# Patient Record
Sex: Female | Born: 1935
Health system: Southern US, Community
[De-identification: ages and names within clinical notes are randomized; demographics above are authoritative.]

## PROBLEM LIST (undated history)

## (undated) DIAGNOSIS — E785 Hyperlipidemia, unspecified: Secondary | ICD-10-CM

## (undated) DIAGNOSIS — I10 Essential (primary) hypertension: Secondary | ICD-10-CM

## (undated) DIAGNOSIS — R0602 Shortness of breath: Secondary | ICD-10-CM

## (undated) DIAGNOSIS — I639 Cerebral infarction, unspecified: Secondary | ICD-10-CM

## (undated) DIAGNOSIS — I4891 Unspecified atrial fibrillation: Secondary | ICD-10-CM

---

## 2001-03-26 ENCOUNTER — Encounter: Admission: RE | Admit: 2001-03-26 | Discharge: 2001-03-26 | Payer: Self-pay | Admitting: Emergency Medicine

## 2001-03-26 ENCOUNTER — Encounter: Payer: Self-pay | Admitting: Emergency Medicine

## 2005-12-06 ENCOUNTER — Encounter: Payer: Self-pay | Admitting: Emergency Medicine

## 2005-12-07 ENCOUNTER — Inpatient Hospital Stay (HOSPITAL_COMMUNITY): Admission: EM | Admit: 2005-12-07 | Discharge: 2005-12-10 | Payer: Self-pay | Admitting: Internal Medicine

## 2005-12-09 ENCOUNTER — Encounter (INDEPENDENT_AMBULATORY_CARE_PROVIDER_SITE_OTHER): Payer: Self-pay | Admitting: *Deleted

## 2006-03-25 ENCOUNTER — Inpatient Hospital Stay (HOSPITAL_COMMUNITY): Admission: EM | Admit: 2006-03-25 | Discharge: 2006-03-31 | Payer: Self-pay | Admitting: *Deleted

## 2006-03-26 ENCOUNTER — Ambulatory Visit: Payer: Self-pay | Admitting: Physical Medicine & Rehabilitation

## 2006-03-26 ENCOUNTER — Ambulatory Visit: Payer: Self-pay | Admitting: Cardiology

## 2006-03-26 ENCOUNTER — Encounter (INDEPENDENT_AMBULATORY_CARE_PROVIDER_SITE_OTHER): Payer: Self-pay | Admitting: Neurology

## 2006-03-26 ENCOUNTER — Encounter: Payer: Self-pay | Admitting: Cardiology

## 2007-03-16 ENCOUNTER — Emergency Department (HOSPITAL_COMMUNITY): Admission: EM | Admit: 2007-03-16 | Discharge: 2007-03-16 | Payer: Self-pay | Admitting: Emergency Medicine

## 2010-07-21 ENCOUNTER — Encounter: Payer: Self-pay | Admitting: Pediatrics

## 2010-11-16 NOTE — Discharge Summary (Signed)
NAME:  Miranda Johnson, Miranda Johnson             ACCOUNT NO.:  [id number]   MEDICAL RECORD NO.:  [id number]          PATIENT TYPE:  INP   LOCATION:  3030                         FACILITY:  [facility]   PHYSICIAN:  Pramod P. Pearlean Brownie, MD    DATE OF BIRTH:  Sep 21, 1928   DATE OF ADMISSION:  03/25/2006  DATE OF DISCHARGE:  03/31/2006                                 DISCHARGE SUMMARY   ADMISSION DIAGNOSIS:  Right-sided weakness and aphasia.   DISCHARGE DIAGNOSES:  1. Large left middle cerebral artery infarction, secondary to embolism      from atrial fibrillation.  2. Atrial fibrillation, new onset.  3. Recent embolic right hemispheric temporal and insular infarcts in June      2007.   HOSPITAL COURSE:  Ms. Mabe is a 77 year old lady of [place] origin, who  presented with sudden onset of aphasia, right-sided weakness at the assisted  living facility where she lives.  She had been dependent prior to this  happening.  She was last seen well at 7 p.m. and she was baking cookies with  other members of the facility.  Her grandson spoke to her at 9:30 p.m. and  she appeared to be fine.  Next morning she was found in her room with right-  sided weakness and not speaking.  Her NIH stroke scale was 19 at the time of  admission.  Patient was not a candidate for __________ intervention due to  time of onset being more than 6 hours.  However, she qualified for  participation in the NEST-2 study, and informed consent was obtained from  her son __________ participation.  Patient was admitted to the stroke unit  where she was kept on telemetry monitoring.  The first 3 days she remained  in sinus rhythm, however, on the 29th she was found to be in atrial  fibrillation.  The patient had previously been advised Coumadin for  paroxysmal atrial fibrillation, but she had declined back in the past.  A CT  scan showed large __________ infarct.  MRA confirmed that.  Followup CT scan  showed a slight hemorrhagic confirmation but  no significant midline shift.  She remained stable during hospital stay with significant global aphasia  with right upper extremity weakness.  She had minimum weakness in the right  leg.  She was seen on consultation by physical, occupational, and speech  therapy.  Lipid profile was elevated and a total cholesterol 229, LDL 162,  HDL 54, triglycerides 65, hemoglobin A1c 5.7.  Homocysteine was 8.7.  A 2D  echo showed normal ejection fraction of 65% without definite clot or patent  foramen ovale.  The patient had been on Zocor.  The dose was increased to 40  mg for her elevated lipids.  She was seen on consultation by Rehab but was  not felt to be a candidate for inpatient rehab, hence, after discussion with  the family, arrangements were made for short-term skilled nursing home  placement.  She was stable on the day of discharge and transferred to  [facility].  She was started on Coumadin for atrial fibrillation,  but  the plan was not to breech her with IV heparin because of the small  hemorrhagic confirmation she had and increased risk for bleeding.  She was  discharge home in stable condition to [facility].   DISCHARGE MEDICATIONS:  1. Coumadin 4 mg a day.  Does to be adjusted to keep INR 2 to 3.  2. Lisinopril 10 mg a day.  3. Hydrochlorothiazide 12.5 mg a day.  4. Calcium carbonate with vitamin D 400 mg daily.  5. Zocor 40 mg a day.  6. Ensure Vanilla Pudding twice a day.   She was advised to follow up with Dr. Pearlean Brownie in his office in 2 months, and  with her primary physician, Dr. Lajean Manes, as needed.           ______________________________  Sunny Schlein. Pearlean Brownie, MD     PPS/MEDQ  D:  03/31/2006  T:  03/31/2006  Job:  [id number]   cc:   Oley Balm. Georgina Pillion, M.D.

## 2010-11-16 NOTE — Consult Note (Signed)
NAME:  [name], [name] NO.:  [id number]   MEDICAL RECORD NO.:  [id number]          PATIENT TYPE:  INP   LOCATION:  3030                         FACILITY:  [facility]   PHYSICIAN:  Deanna Artis. Hickling, M.D.DATE OF BIRTH:  09-30-1928   DATE OF CONSULTATION:  12/07/2005  DATE OF DISCHARGE:                                   CONSULTATION   CHIEF COMPLAINT:  She once had a stroke.   HISTORY OF PRESENT ILLNESS:  Miranda Johnson is a 77 year old [place] woman who  has been widowed for a year and a half.  The patient presented with visual  discharge. She says the clock and the TV looked as if they were scrambled.  The VCR she could only see two of the four numbers.  She had auditory  hallucinations of a sweet voice saying hello and voices that were singing  to her.  She presented to the emergency room with a head CT scan that showed  a possible remote infarction in the left basal ganglia.  She was admitted  for evaluation and on MRI scan bilateral lesions, right posterior temporal  parietal and left opercular as well as a small subcentimeter lesions were  seen suggesting the presence of an acute cardioembolic infarction.  The  patient has been physically healthy for all of her life.  Recently she has  had some problems with anxiety which is treated with p.r.n. Xanax and with  hypertension, treated with hydrochlorothiazide.   PAST MEDICAL HISTORY:  The patient has not had any hospitalization, any  surgery.   REVIEW OF SYSTEMS:  Remarkable for headache and ear pain for four days  duration.  She is postmenopausal.  She has had occasional pain in her neck.  It occurs while sleeping.  She only sleeps about four hours per night.  Twelve system review is otherwise negative.   CURRENT MEDICATIONS:  1.  Hydrochlorothiazide 25 mg daily.  2.  Calcium 1250 mg twice daily.   ALLERGIES:  None known.   INTOLERANCES:  ASPIRIN upsets her stomach.   The patient is grieving the death of a  friend earlier this week.  She was  visiting her doctor, the same physician that cared for her late husband.  This created a great deal of emotional upset and she remembers crying  uncontrollably on the same day as the  hallucinations occurred.  I was asked  to see her to determine the etiology of her dysfunction and make  recommendations for further work-up and treatment.   FAMILY HISTORY:  Negative for stroke.  Her mother had some form of liver  disease and died at age 14.  She tried to explain it to me but I was not  able to understand.  Her father died of complications of tobacco, alcohol,  and obesity at age 71.  There is no family history of strokes or  atherosclerotic disease, hypertension, diabetes.  There is a strong family  history of cancer.   SOCIAL HISTORY:  The patient does not smoke cigarettes, drink alcohol. She  is widowed.  Her daughter lives in town.  There are  no barriers to her care.  She has been fully independent in her activities of daily living prior to  this incident.   PHYSICAL EXAMINATION:  VITAL SIGNS:  Temperature 97, blood pressure 134/76,  resting pulse 70, respirations 18, oxygen saturation 95%.  HEENT:  Neck is supple.  No meningismus.  No bruits.  LUNGS:  Clear.  HEART:  No murmurs.  pulses normal.  ABDOMEN:  Soft. Bowel sounds normal.  EXTREMITIES:  Unremarkable.  NEUROLOGIC:  NIH stroke scale score was 0.  Mental status:  The patient was  alert, normal language, praxis and memory.  Normal orientation.  Cranial  nerves:  Round and reactive pupils.  Visual fields full to simultaneous  stimuli.  Symmetric facial strength, midline tongue and uvula.  Air  conduction greater than bone conduction bilaterally.  Motor examination:  Normal strength, tone and mass.  Good fine motor movements.  No pronator  drift.  Sensation intact to cold, vibration.  Stereoagnosis and also double  simultaneous noxious stimuli.  Cerebellar examination:  Good  finger-to-nose,  rapid repetitive movements in heel, knee, shin.  Deep tendon reflexes:  She  seems to have a left reflex predominance.  Deep tendon reflexes were present  bilaterally, arms and legs.  She had bilateral flexor plantar responses.  Her gait is slightly broad-based, but she has no signs of circumduction or  hemiparesis.   IMPRESSION:  1.  Acute bilateral ischemic infarctions involving the right posterior      temporal parietal and the left posterior operculum, and  subcentimeter      lesions in the parietal cortex.  I strongly suspect cardiac embolus as      the etiology though the patient has never had heart disease.  434.11.  2.  No signs of intrinsic heart disease.  3.  The only risk factors noted was hypertension.  4.  The patient had auditory hallucinations.  Need to rule out seizures if      those symptoms recur because the lesion in the left opercular  region      could certainly support the presence of a auditory simple partial      seizure.  The patient is under considerable acute stress, but I do not      think that has anything to do with these strokes.   PLAN:  1.  Intravenous heparin for now.  2.  A 2-D echocardiogram.  We may need to do a transesophageal      echocardiogram and carotid Dopplers December 09, 2005.  3.  Hemoglobin A1C, fasting lipid, serum homocystine.  4.  Treat the acute hypertension gently.  5.  Telemetry.   I appreciate the opportunity to participate in her care.      Deanna Artis. Sharene Skeans, M.D.  Electronically Signed     [facility]/MEDQ  D:  12/07/2005  T:  12/09/2005  Job:  [id number]   cc:   Melissa L. Ladona Ridgel, MD

## 2010-11-16 NOTE — H&P (Signed)
NAME:  Miranda Johnson, Miranda Johnson NO.:  [id number]   MEDICAL RECORD NO.:  [id number]          PATIENT TYPE:  INP   LOCATION:  3030                         FACILITY:  [facility]   PHYSICIAN:  Michelene Gardener, MD    DATE OF BIRTH:  1929-05-09   DATE OF ADMISSION:  12/07/2005  DATE OF DISCHARGE:                                HISTORY & PHYSICAL   PRIMARY CARE PHYSICIAN:  Oley Balm. Georgina Pillion, M.D.   CHIEF COMPLAINT:  Visual disturbances and hearing abnormalities.   HISTORY OF PRESENT ILLNESS:  This is 77 year old Hispanic female with past  medical history of hypertension who presents with the above mentioned  complaints.  The patient stated that around four days ago she was  complaining of very severe headache and pain behind her ear. Those responded  to pain medication and the headache has been there for around two days.  Headache resolved and the patient was doing fine.  The patient had two  stressful events during this week.  The first one she lost one of her close  friends during this week.  The second one was yesterday when she went with a  friend to her doctor's office.  The office happened to be in the same office  where her husband used to follow and he died about 1-1/2 years ago.  During  the visit, she remembered her husband and she became very stressed out.  When she went home, she started crying all the day and had flashbacks about  the loss of her husband and she has been stressed.  During the night she had  her dinner and then stated that while she was washing the dishes, she heard  voices of some people who were not present and then after awhile she lost  her hearing and she could not hear anything for around 10 minutes. During  the same night also, she lost her vision.  She said only she could half of  things so when she is looking at the TV, she only saw half of the TV.  She  saw half of the clock in front of her.  This also been there for around 10  minutes and then  resolved.  Denied numbness.  There is no tingling.  There  is no weakness.  There is no slurred speech.  There is on bowel or urinary  incontinence.   PAST MEDICAL HISTORY:  Hypertension.   PAST SURGICAL HISTORY:  Denied.   ALLERGIES:  No known drug allergies.   MEDICATIONS:  Hydrochlorothiazide 25 mg p.o. once daily.   SOCIAL HISTORY:  The patient denies smoking, denies alcohol, and denies  recreational drugs.   FAMILY HISTORY:  She said all her family was very healthy and denied any  known medical problems.  There is no hypertension.  No diabetes.  No  coronary artery disease and no history of CVA.   REVIEW OF SYSTEMS:  Positive for visual loss, hearing hallucinations,  hearing loss and headaches.  CONSTITUTIONAL:  There is no fever, no  weakness, no weight changes.  EYES:  Positive for visual loss.  There  is no  double vision. There is no pain and no weakness.  ENT:  No ear pain.  There  is transient hearing loss.  No epistaxis.  No sinus pain.  No difficulty  swallowing.  RESPIRATORY:  No cough, no wheezes, no hemoptysis, no dyspnea.  CARDIOVASCULAR:  No chest pain, no orthopnea, no edema.  No palpitations.  GI:  No nausea, no vomiting, no diarrhea, no abdominal pain, no  constipation.  GU:  No dysuria, no nocturia, no frequency.  ENDOCRINE:  No  polyuria, no nocturia, no sweating.  HEMATOLOGY:  No bruises, no bleeding.  INFECTIOUS DISEASE:  No rash, no lesions.  NEUROLOGIC:  Positive for visual  loss, hearing loss.  No tumors, no headache and no seizures.  PSYCHIATRY:  Positive for the stress, insomnia, and hearing hallucinations.  The rest of  the systems are reviewed and they were negative.   PHYSICAL EXAMINATION:  VITAL SIGNS:  Temperature 97, blood pressure 188/82,  pulse 61, respiratory rate 20.  GENERAL:  This is an elderly female who is lying down in bed and in no acute  distress at the present time.  HEENT:  Conjunctivae are normal.  There is no pallor; no erythema.   Pupils  are equal and reactive to light and accommodation. There is no ptosis.  Hearing is intact.  There is no ear discharge or infection.  There is no  nose discharge, infection, or bleeding.  Oral mucosa is moist and there is  no pharyngeal erythema.  NECK:  Supple.  No JVD.  No carotid bruits.  No lymphadenopathy.  No thyroid  enlargement.  No thyroid tenderness.  CARDIOVASCULAR EXAMINATION:  S1 and S2 were heard.  There are no additional  heart sounds.  There are no murmurs, no gallops, and no thrills.  RESPIRATORY:  The patient was breathing between 18 and 20. There is no use  of accessory muscles.  No intercostal retractions. No rales, no rhonchi, and  no wheezing.  ABDOMEN:  Soft, not distended, no tenderness.  No hepatosplenomegaly.  Bowel  sounds are normal.  Umbilicus is central.  EXTREMITIES:  Lower extremities showed no edema, no rash and no varicose  veins.  SKIN:  No rash, no erythema.  No ulcers.  Skin is warm to touch.  NEUROLOGICAL EXAMINATION:  Cranial nerves II-XII intact. Strength 5/5 in all  four extremities. Reflexes are 2/2 in all major reflexors.  Visual fields  are normal.  Coordination is okay.  Sensation is intact to pain and touch  sensation.   LABORATORY DATA:  CT scan of the head is negative.  WBC 8.2, hemoglobin  14.1, hematocrit 40, MCV 93.4, platelet count is 348.  Sodium 136, potassium  3.5, chloride 99, bicarb 50, glucose 135, BUN 15, creatinine 1.1, calcium  9.7, AST 20, ALT 16.  Alkaline phosphatase is 53.  Urinalysis showed only  positive leukocyte esterase which is large, but the urea nitrite is  negative.  The wbc's 3-6, bacteria rare.   IMPRESSION:  1.  Transient ischemic attack.  As mentioned above, this patient has      transient symptoms which include transient visual loss, transient      hearing loss and hearing hallucinations and headache a few days ago.      Those symptoms might constitute transient ischemic attack.  The patient      already had CT scan of the head that came to be normal. We will admit      this patient to the  regular medical floor.  Will start her on aspirin      325 mg p.o. once daily.  I will also get an MRI for further evaluation.      I do not think this patient needs an echocardiogram or carotid Doppler      at the present time.  The possibility of stroke is mild but will be a      consideration.  This whole episode might be secondary to the recent      stress in her life.  So after checking the MRI, if this is normal, then      we will consider getting a psychiatric evaluation.  2.  Anxiety.  As mentioned, this patient has been under a lot of stress      recently and her whole condition might be secondary to stress.  So we      will give her Xanax 0.25 mg p.o. q.8h. p.r.n.  Will also get psychiatric      evaluation after the MRI.  3.  Hypertension.  Her blood pressure was high.  The patient is not      compliant with her medications.  Will restart her hydrochlorothiazide      and we will recheck her blood pressure.  4.  Urinary tract infection.  This patient has mild urinary tract infection      with positive leukocyte esterase and rare      amount of bacteria and wbc's 3-6 which might not even represent urinary      tract infection.  I will not treat her for right present time.  I will      get urine culture and if this is positive, then we will give her some      antibiotics.  She is asymptomatic at the present time.  Denies      frequency, hesitancy, or pain on urination.      Michelene Gardener, MD  Electronically Signed     NAE/MEDQ  D:  12/07/2005  T:  12/07/2005  Job:  [id number]   cc:   Oley Balm. Georgina Pillion, M.D.  Fax: [phone]

## 2010-11-16 NOTE — H&P (Signed)
NAME:  Miranda Johnson, Miranda Johnson NO.:  [id number]   MEDICAL RECORD NO.:  [id number]          PATIENT TYPE:  INP   LOCATION:  3030                         FACILITY:  [facility]   PHYSICIAN:  Deanna Artis. Hickling, M.D.DATE OF BIRTH:  May 28, 1929   DATE OF ADMISSION:  03/25/2006  DATE OF DISCHARGE:                                HISTORY & PHYSICAL   CHIEF COMPLAINT:  Right-sided weakness and aphasia (slurred speech.)   HISTORY OF PRESENT ILLNESS:  A 77 year old right-handed [place], widowed woman  with a prior admission to [facility] [place] through 12, 2007,  for embolic and non-hemorrhagic infarction involving the right posterior  temporal and right posterior insular regions.  No sources of cardiogenic  emboli were found.  The patient had an extensive workup that included  evidence of hypertension, mild dyslipidemia.  No sources of obstruction in  her carotid arteries, no source of embolus with her  heart.  The patient  elected not to start aspirin because of worries about bleeding.  She was  placed on aspirin and hydrochlorothiazide and discharged to home.  She was  scheduled to follow up into 3 months of Dr. Marlis Edelson office for a bubble  study and embolic monitoring.  The patient however did not follow up.   The patient subsequently recovered completely and was living independently  in an assisted living facility.  She was last seen well at 7:00 p.m. when  she was baking cookies with other members of her facility.  She had a phone  call from her grandson at 9:30 p.m.  Her voice and language was normal.  She  said that she was turning in to go to bed.  This afternoon after lunch, she  was found in her room.  She had not been seen all morning.  She [facility] at  [facility] at 1402 hours.  CT scan of the brain at 1518.  Call to  me placed at 1606.  She had evidence of a massive left middle cerebral  artery distribution infarction involving the entire left middle  cerebral  artery distribution with small petechial bleed.  NIH stroke scale was 19.  The patient qualified for the NES II study and after informed consent given  to  her son, she was entered into it.   PAST MEDICAL HISTORY:  The patient's health has been good except for the  above-noted medical problems.   PAST SURGICAL HISTORY:  None.   REVIEW OF SYSTEMS:  Negative except for insomnia, depression, osteopenia,  mild dyslipidemia and hypertension.   MEDICATIONS:  Aspirin, lisinopril/hydrochlorothiazide, others are unknown.   ALLERGIES:  None known.  Aspirin does upset her stomach but she was able to  tolerate it.   FAMILY HISTORY:  Mother died of non alcoholic cirrhosis at age 22.  Father  had kidney stones and syncope and died at age 36.  He had complications of  alcohol, tobacco and obesity.  Brother has prostate cancer.   SOCIAL HISTORY:  Daughter lives in [place].  Son lives in [place].  He has been estranged from her since Mother's Day, when he is  not able to be  with her.  He is at bedside at this time.  She does not use tobacco or  alcohol.   PHYSICAL EXAMINATION:  VITAL SIGNS:  On examination today blood pressure  134/65, resting pulse 61, respirations 20, temperature 98, oxygen saturation  98%.  HEENT:  Ears, nose and throat, no signs of infection.  NECK:  Supple.  LUNGS:  Clear.  HEART:  No murmurs.  Pulses normal.  ABDOMEN:  Soft.  Bowel sounds normal.  No hepatosplenomegaly.  EXTREMITIES:  Unremarkable.  NEUROLOGIC:  Aphasic, global.  She nods yes to everything.  She is mute.  Cranial nerves, round reactive pupils.  Fundi normal.  Dense right  homonymous hemianopsia, right central seventh mild.  Motor examination  flaccid plegia on the right.  Sensory examination decreased on the right.  I  cannot determine the presence or absence of extinction.  She is  hyperreflexic right greater than left.  She has a right extensor and left  flexor plantar  response.   IMPRESSION:  Large middle cerebral artery distribution infarction.  She is  beyond the t-PA windows.  She qualifies for the NEST-2 study and will be  entered.  Her NIH stroke scale will be computed just before starting the  double blind sham protocol.  Her modified Rankin at this time is 5, post  stroke.  Will admit her, perform MRI, MRA, noninvasive workup and treat her  with Lovenox for DVT prophylaxis. Give her aspirin.  She will be n.p.o.  until she passes a swallowing study.  Will get her on her home medicines to  make certain that we are giving her all the medicines that she should have.      Deanna Artis. Sharene Skeans, M.D.  Electronically Signed     [facility]/MEDQ  D:  03/25/2006  T:  03/26/2006  Job:  [id number]   cc:   Oley Balm. Georgina Pillion, M.D.

## 2010-11-16 NOTE — Discharge Summary (Signed)
NAME:  Miranda Johnson, Miranda Johnson             ACCOUNT NO.:  [id number]   MEDICAL RECORD NO.:  [id number]          PATIENT TYPE:  INP   LOCATION:  3030                         FACILITY:  [facility]   PHYSICIAN:  Miranda L. Ladona Ridgel, MD  DATE OF BIRTH:  02-09-1929   DATE OF ADMISSION:  12/07/2005  DATE OF DISCHARGE:  12/10/2005                                 DISCHARGE SUMMARY   CHIEF COMPLAINT AT ADMISSION:  Auditory hallucinations and visual field  deficits.   DISCHARGING DIAGNOSES:  1.  Bilateral strokes involving the right posterior temporal occipital      region and the left posterior opercular region, as well as the left      parietal region.  The patient was started on Heparin and underwent      stroke evaluation.  No obvious source for emboli was noted.  The patient      elected not to start Coumadin and therefore will be started on aspirin.      The patient has been requested to follow up with Miranda Johnson as an      outpatient but at this time she is hesitant to do so.  She wishes to      speak with Miranda Johnson first.  Recommendations are however to follow up      with Miranda Johnson in 2-3 months and to follow up on a second appointment      for a bubble study and emboli monitoring.  The phone number for Dr.      Marlis Johnson office is [phone].  2.  Urinary tract infection.  On admission the patient was found to have      large leukocyte esterase and symptoms of a UTI so therefore we started      on p.o. Cipro.  She completed 3 days of therapy and her urine culture      returned negative and therefore antibiotic therapy was discontinued.  3.  Hypertension.  The patient will continue with her hydrochlorothiazide.  4.  Please note that the patient has a slightly elevated LDL and total      cholesterol.  At this time she does not wish to take a medication for      this.  She feels that she already follows a heart-healthy diet and      therefore we will ask Miranda Johnson to review medication therapy with her    and/or continue to  monitor her closely.   Please note:  The patient has very strong feelings about what quality of  life means to her and therefore she has refused Coumadin.  She does not wish  to be saddled with the side effects as well as monitoring, because along  with Coumadin she feels that at her age and with her experiences her quality  would be greatly diminished if she were to start that.   MEDICATIONS AT THE TIME OF DISCHARGE:  1.  Aspirin 325 mg once daily.  2.  Hydrochlorothiazide 25 mg once daily.  3.  She can resume her calcium supplementation  as well flax oil.   HOSPITAL COURSE:  The  patient is a 77 year old [place] female who presented to  the emergency room on December 06, 2005.  The patient states that she was having  a dinner party at her home and afterwards was cleaning up when she though  that she heard someone saying hello.  She turned around to look, saw no  one, and then further heard a child singing in a very high voice.  She went  into the living room to investigate this because she knew that there was no  one in her home.  Upon arrival in the emergency room, she was looking at the  VCR and noted that there were several numbers that were lost out of her  visual field.  She became further worried when she turned on the dishwasher  and noted that she could not  hear the dishwasher for several seconds to  minutes.  Her hearing ultimately returned and because she was so upset with  this and agitated, she called her daughter-in-law and had her bring her to  the emergency room.  In the emergency room, the patient was asymptomatic  with regard to the complaints that she had voiced, however she was very  agitated and anxious and thought that maybe this was related to stress.  The  patient was asked to be admitted by [facility] after finding that  her CT scan was negative.  The patient was admitted to the general medical  floor initially and an MRI was undertaken  the following morning.  MRI showed  bilateral acute strokes involving the right posterior temporal occipital  region and the left posterior opercular region with left parietal  involvement.  The patient was started on heparin at that time with no bolus,  placed on telemetry for monitoring and further stroke workup was completed.  2-D echo showed no obvious embolic source but could not rule out a VSD.  Ultrasound carotids of the neck were negative for any internal carotid  artery stenosis.  The patient was seen and evaluated by the Stroke Service.  She underwent further testing with a transesophageal echo which showed no  obvious source for emboli.  They could not rule out VSD with their study.  Recommendation therefore was to have the patient follow up with Miranda Johnson in  2-3 months and to have an outpatient transcranial Doppler follow up.  On the  day of discharge the patient is hemodynamically stable.  She has had no  further symptomatology since the night of admission.  Her vital signs reveal  a temperature of 98.2, blood pressure of 137/69, pulse 65, respirations 18,  saturation 93%.   PHYSICAL EXAMINATION:  Miranda Johnson is very pleasant [place] female in no  acute distress.  HEENT:  She is Normocephalic and atraumatic. Pupils equal, round and  reactive to light.  Extraocular muscles are intact.  Mucous membranes are  moist.  NECK:  Supple, no JVD, no lymph nodes, no carotid bruits.  CHEST:  Clear to auscultation.  There is no rhonchi, rales or wheezes.  CARDIOVASCULAR:  Regular rate and rhythm, no rhonchi, rales or wheezes are  noted.  ABDOMEN:  Soft, nontender, nondistended with positive bowel sounds.  EXTREMITIES:  Show no clubbing, cyanosis or edema.  NEURO:  She has no neurological deficits.  Power is 5/5, DTRs are 2+,  plantars are downgoing.   PERTINENT LABORATORY VALUES DURING THE COURSE OF HOSPITAL STAY:  A discharging white count of 6.3, hemoglobin 13.4, hematocrit 34.8,  and  platelets of 290.  Her BUN is 11, creatinine is 0.8.  Her cholesterol is 196  with an LDL of 135, HDL of 43 and triglycerides  of 92.  Her urine culture  showed no growth on the second and first samplings.  Her hemoglobin A1c  showed a 5.6 level.  RPR was nonreactive.  Her TSH was 2.105, and her  homocysteine level was normal at 9.08.   Please note as stated, her transthoracic echo showed no overall problems  with her left ventricular systolic function, no diagnostic evidence for wall  motion abnormalities.  There was some thickening of the aortic valve and  mild leukotomatis proliferation of her mitral valve.  There was mild mitral  regurgitation.  There is a diastolic flow in the premembranous inlet of the  ventricular septal region suggesting the right coronary artery being present  there, but they were unable to exclude a VSD defect.  No specific ejection  fraction is quoted.   At this time, the patient is deemed stable for followup as an outpatient to  Miranda Johnson.  We have made recommendations to Miranda Johnson, however the patient  is hesitant to return to Miranda Johnson and would like to speak to Miranda Johnson  first.  Recommendations for followup in 2-3 months with a transcranial  Doppler study in the interim, at Dr.  Marlis Johnson office.  At this time the patient is stable but at risk for future  stroke.  She has made her wishes known that she is happy with her current  life and does not wish to pursue more aggressive interventions, namely  Coumadin or anti-cholesterol medications.      Miranda L. Ladona Ridgel, MD  Electronically Signed     MLT/MEDQ  D:  12/10/2005  T:  12/10/2005  Job:  [id number]   cc:   Pramod P. Pearlean Brownie, MD  Fax: [phone]   Oley Balm. Georgina Johnson, M.D.  Fax: [phone]

## 2012-08-19 ENCOUNTER — Other Ambulatory Visit: Payer: Self-pay | Admitting: Family Medicine

## 2012-08-19 DIAGNOSIS — R29898 Other symptoms and signs involving the musculoskeletal system: Secondary | ICD-10-CM

## 2012-08-19 DIAGNOSIS — Z8673 Personal history of transient ischemic attack (TIA), and cerebral infarction without residual deficits: Secondary | ICD-10-CM

## 2013-01-21 ENCOUNTER — Encounter (HOSPITAL_COMMUNITY): Payer: Self-pay | Admitting: *Deleted

## 2013-01-21 ENCOUNTER — Emergency Department (HOSPITAL_COMMUNITY)
Admission: EM | Admit: 2013-01-21 | Discharge: 2013-01-21 | Disposition: A | Discharge status: Home / Self Care | Payer: Medicare Other | Attending: Emergency Medicine | Admitting: Emergency Medicine

## 2013-01-21 ENCOUNTER — Emergency Department (HOSPITAL_COMMUNITY): Payer: Medicare Other

## 2013-01-21 DIAGNOSIS — I4891 Unspecified atrial fibrillation: Secondary | ICD-10-CM | POA: Insufficient documentation

## 2013-01-21 DIAGNOSIS — J3489 Other specified disorders of nose and nasal sinuses: Secondary | ICD-10-CM | POA: Insufficient documentation

## 2013-01-21 DIAGNOSIS — Z7901 Long term (current) use of anticoagulants: Secondary | ICD-10-CM | POA: Insufficient documentation

## 2013-01-21 DIAGNOSIS — Z79899 Other long term (current) drug therapy: Secondary | ICD-10-CM | POA: Insufficient documentation

## 2013-01-21 DIAGNOSIS — Z8673 Personal history of transient ischemic attack (TIA), and cerebral infarction without residual deficits: Secondary | ICD-10-CM | POA: Insufficient documentation

## 2013-01-21 DIAGNOSIS — R490 Dysphonia: Secondary | ICD-10-CM | POA: Insufficient documentation

## 2013-01-21 DIAGNOSIS — J811 Chronic pulmonary edema: Secondary | ICD-10-CM | POA: Insufficient documentation

## 2013-01-21 DIAGNOSIS — R0989 Other specified symptoms and signs involving the circulatory and respiratory systems: Secondary | ICD-10-CM

## 2013-01-21 HISTORY — DX: Cerebral infarction, unspecified: I63.9

## 2013-01-21 LAB — COMPREHENSIVE METABOLIC PANEL
ALT: 18 U/L (ref 0–35)
AST: 27 U/L (ref 0–37)
CO2: 28 mEq/L (ref 19–32)
Calcium: 9.9 mg/dL (ref 8.4–10.5)
Chloride: 97 mEq/L (ref 96–112)
Creatinine, Ser: 0.63 mg/dL (ref 0.50–1.10)
GFR calc Af Amer: >90 mL/min (ref >90)
GFR calc non Af Amer: 81 mL/min — ABNORMAL LOW (ref >90)
Glucose, Bld: 118 mg/dL — ABNORMAL HIGH (ref 70–99)
Sodium: 136 mEq/L (ref 135–145)
Total Bilirubin: 1.1 mg/dL (ref 0.3–1.2)

## 2013-01-21 LAB — CBC WITH DIFFERENTIAL/PLATELET
Basophils Absolute: 0 10*3/uL (ref 0.0–0.1)
Eosinophils Relative: 0 % (ref 0–5)
HCT: 41.5 % (ref 36.0–46.0)
Lymphocytes Relative: 8 % — ABNORMAL LOW (ref 12–46)
Lymphs Abs: 0.6 10*3/uL — ABNORMAL LOW (ref 0.7–4.0)
MCV: 90 fL (ref 78.0–100.0)
Monocytes Absolute: 0.5 10*3/uL (ref 0.1–1.0)
Neutro Abs: 6.6 10*3/uL (ref 1.7–7.7)
RBC: 4.61 MIL/uL (ref 3.87–5.11)
RDW: 13.3 % (ref 11.5–15.5)
WBC: 7.7 10*3/uL (ref 4.0–10.5)

## 2013-01-21 LAB — PROTIME-INR
INR: 2.28 — ABNORMAL HIGH (ref 0.00–1.49)
Prothrombin Time: 24.4 seconds — ABNORMAL HIGH (ref 11.6–15.2)

## 2013-01-21 MED ORDER — FUROSEMIDE 20 MG PO TABS: 20.0000 mg | ORAL_TABLET | Freq: Every day | ORAL | Status: DC | PRN

## 2013-01-21 NOTE — ED Notes (Signed)
Pt. Ambulated from out of her room to the charge nurse desk. Pt heart rate was at 107 and oxygen level was at 99%.  Nurse was notified of pt. Ambulation,

## 2013-01-21 NOTE — ED Notes (Signed)
Per EMS pt coming from [facility] nursing home with c/o chest congestion and cough x several days. Per EMS pt was ambulatory on scene, has no other complaints. Pt is spanish speaking only, her son is at bedside to interpret.

## 2013-01-21 NOTE — ED Notes (Signed)
Per pt's sone pt is having "gurgling in her throat that makes her cough and makes it difficult for her to breath". Per son pt denies any chest pain, cough or SOB until "gurgling starts.

## 2013-01-21 NOTE — ED Notes (Signed)
[id number]> Expected date:<BR> Expected time:<BR> Means of arrival:<BR> Comments:<BR> Chest congestion

## 2013-01-21 NOTE — ED Provider Notes (Signed)
History    CSN: [id number] Arrival date & time 01/21/13  0909  First MD Initiated Contact with Patient 01/21/13 [phone]     Chief Complaint  Patient presents with  . Cough   (Consider location/radiation/quality/duration/timing/severity/associated sxs/prior Treatment) HPI Pt arrvied from [facility] for 2-3 days of "gurgling in her throat" with associated hoarseness. + rhinorrhea and mild cough. Son seems to think this is unchanged. No fever or chills. No SOB or CP. Pt with previous CVA and has residual dysphasia and R sided weakness.  Past Medical History  Diagnosis Date  . Stroke    History reviewed. No pertinent past surgical history. History reviewed. No pertinent family history. History  Substance Use Topics  . Smoking status: Never Smoker   . Smokeless tobacco: Not on file  . Alcohol Use: No   OB History   Grav Para Term Preterm Abortions TAB SAB Ect Mult Living                 Review of Systems  Constitutional: Negative for fever and chills.  HENT: Positive for rhinorrhea and voice change. Negative for sore throat, neck pain and neck stiffness.   Respiratory: Positive for cough. Negative for shortness of breath.   Cardiovascular: Negative for chest pain, palpitations and leg swelling.  Gastrointestinal: Negative for nausea, vomiting, abdominal pain and diarrhea.  Genitourinary: Negative for dysuria and frequency.  Musculoskeletal: Negative for myalgias and back pain.  Skin: Negative for pallor, rash and wound.  Neurological: Negative for dizziness, weakness, light-headedness, numbness and headaches.  All other systems reviewed and are negative.    Allergies  Review of patient's allergies indicates no known allergies.  Home Medications   Current Outpatient Rx  Name  Route  Sig  Dispense  Refill  . calcium-vitamin D (OSCAL WITH D) 500-200 MG-UNIT per tablet   Oral   Take 1 tablet by mouth 2 (two) times daily.         . Cholecalciferol (VITAMIN D) 2000  UNITS CAPS   Oral   Take 1 capsule by mouth every morning.         . hydrochlorothiazide (MICROZIDE) 12.5 MG capsule   Oral   Take 12.5 mg by mouth every morning.         . sertraline (ZOLOFT) 100 MG tablet   Oral   Take 100 mg by mouth every morning.         . simvastatin (ZOCOR) 40 MG tablet   Oral   Take 40 mg by mouth at bedtime.         Marland Kitchen warfarin (COUMADIN) 1 MG tablet   Oral   Take 3-3.5 mg by mouth as directed. 3.5mg  orally mon, wed, fri, and Sunday. 3mg  orally on tue, thur, and sat          BP 130/75  Pulse 89  Temp(Src) 97.8 F (36.6 C) (Oral)  Resp 20  SpO2 98% Physical Exam  Nursing note and vitals reviewed. Constitutional: She is oriented to person, place, and time. She appears well-developed and well-nourished. No distress.  HENT:  Head: Normocephalic and atraumatic.  Mouth/Throat: Oropharynx is clear and moist. No oropharyngeal exudate.  No intraoral swelling  Eyes: EOM are normal. Pupils are equal, round, and reactive to light.  Neck: Normal range of motion. Neck supple.  Cardiovascular: Regular rhythm.   tachycardia  Pulmonary/Chest: Effort normal. No respiratory distress. She has no wheezes. She has rales.  Rhonchi and crackles bl bases.   Abdominal: Soft. Bowel  sounds are normal. She exhibits no distension and no mass. There is no tenderness. There is no rebound and no guarding.  Musculoskeletal: Normal range of motion. She exhibits no edema and no tenderness.  No calf swelling or tenderness  Neurological: She is alert and oriented to person, place, and time.  5/5 motor bl upper ext and LLE, 4/5 motor RLE. sensation grossly intact   Skin: Skin is warm and dry. No rash noted. No erythema.  Psychiatric: She has a normal mood and affect. Her behavior is normal.    ED Course  Procedures (including critical care time) Labs Reviewed  CBC WITH DIFFERENTIAL - Abnormal; Notable for the following:    Platelets 143 (*)    Neutrophils Relative %  86 (*)    Lymphocytes Relative 8 (*)    Lymphs Abs 0.6 (*)    All other components within normal limits  COMPREHENSIVE METABOLIC PANEL - Abnormal; Notable for the following:    Glucose, Bld 118 (*)    GFR calc non Af Amer 81 (*)    All other components within normal limits  PRO B NATRIURETIC PEPTIDE - Abnormal; Notable for the following:    Pro B Natriuretic peptide (BNP) 2686.0 (*)    All other components within normal limits  PROTIME-INR - Abnormal; Notable for the following:    Prothrombin Time 24.4 (*)    INR 2.28 (*)    All other components within normal limits  TROPONIN I  URINALYSIS, ROUTINE W REFLEX MICROSCOPIC   Dg Chest 2 View  01/21/2013   *RADIOLOGY REPORT*  Clinical Data: Cough  CHEST - 2 VIEW  Comparison: 03/25/2006  Findings: The heart and pulmonary vascularity are within normal limits.  The lungs are clear bilaterally.  No acute bony abnormality is seen.  IMPRESSION: No acute abnormality noted.   Original Report Authenticated By: Alcide Clever, M.D.   1. Pulmonary vascular congestion   2. Atrial fibrillation     Date: 01/21/2013  Rate:109  Rhythm: atrial fibrillation  QRS Axis: normal  Intervals: normal  ST/T Wave abnormalities: nonspecific T wave changes  Conduction Disutrbances:none  Narrative Interpretation:   Old EKG Reviewed: unchanged   MDM  Pt is well appearing. Saturations remain in 90's. No respiratory distress. Elevated BNP though unknown baseline. Think pt likely has mild pulmonary edema. Discussed with son and pt. Advised of need to f/u with PMD for possible cardiology referral. Will start on low dose prn lasix. I have given explicit return precautions.   Loren Racer, MD 01/21/13 1235

## 2013-01-21 NOTE — ED Notes (Signed)
Pt. Had gave a urine specimen. Urine specimen has to be recollected. Pt had wrong label on specimen. Pt. Is aware of giving another specimen.

## 2013-01-26 ENCOUNTER — Other Ambulatory Visit: Payer: Self-pay | Admitting: Family Medicine

## 2013-01-26 ENCOUNTER — Other Ambulatory Visit (HOSPITAL_COMMUNITY): Payer: Self-pay | Admitting: Family Medicine

## 2013-01-26 ENCOUNTER — Ambulatory Visit
Admission: RE | Admit: 2013-01-26 | Discharge: 2013-01-26 | Disposition: A | Discharge status: Home / Self Care | Payer: Medicare Other | Source: Ambulatory Visit | Attending: Family Medicine | Admitting: Family Medicine

## 2013-01-26 DIAGNOSIS — J811 Chronic pulmonary edema: Secondary | ICD-10-CM

## 2013-01-29 ENCOUNTER — Encounter (HOSPITAL_COMMUNITY): Payer: Self-pay

## 2013-01-29 ENCOUNTER — Ambulatory Visit (HOSPITAL_COMMUNITY)
Admission: RE | Admit: 2013-01-29 | Discharge: 2013-01-29 | Disposition: A | Discharge status: Home / Self Care | Payer: Medicare Other | Source: Ambulatory Visit | Attending: Family Medicine | Admitting: Family Medicine

## 2013-01-29 DIAGNOSIS — I10 Essential (primary) hypertension: Secondary | ICD-10-CM | POA: Insufficient documentation

## 2013-01-29 DIAGNOSIS — J811 Chronic pulmonary edema: Secondary | ICD-10-CM

## 2013-01-29 DIAGNOSIS — R0602 Shortness of breath: Secondary | ICD-10-CM

## 2013-01-29 DIAGNOSIS — R0989 Other specified symptoms and signs involving the circulatory and respiratory systems: Secondary | ICD-10-CM | POA: Insufficient documentation

## 2013-01-29 DIAGNOSIS — R0609 Other forms of dyspnea: Secondary | ICD-10-CM | POA: Insufficient documentation

## 2013-01-29 DIAGNOSIS — Z8673 Personal history of transient ischemic attack (TIA), and cerebral infarction without residual deficits: Secondary | ICD-10-CM | POA: Insufficient documentation

## 2013-01-29 HISTORY — DX: Shortness of breath: R06.02

## 2013-01-29 NOTE — Progress Notes (Signed)
  Echocardiogram 2D Echocardiogram has been performed.  Cathie Beams 01/29/2013, 1:47 PM

## 2013-02-16 ENCOUNTER — Emergency Department (HOSPITAL_COMMUNITY): Payer: Medicare Other

## 2013-02-16 ENCOUNTER — Emergency Department (HOSPITAL_COMMUNITY)
Admission: EM | Admit: 2013-02-16 | Discharge: 2013-02-17 | Disposition: A | Discharge status: Home / Self Care | Payer: Medicare Other | Attending: Emergency Medicine | Admitting: Emergency Medicine

## 2013-02-16 ENCOUNTER — Encounter (HOSPITAL_COMMUNITY): Payer: Self-pay | Admitting: Emergency Medicine

## 2013-02-16 DIAGNOSIS — R490 Dysphonia: Secondary | ICD-10-CM | POA: Insufficient documentation

## 2013-02-16 DIAGNOSIS — R630 Anorexia: Secondary | ICD-10-CM | POA: Insufficient documentation

## 2013-02-16 DIAGNOSIS — Z888 Allergy status to other drugs, medicaments and biological substances status: Secondary | ICD-10-CM | POA: Insufficient documentation

## 2013-02-16 DIAGNOSIS — R51 Headache: Secondary | ICD-10-CM | POA: Insufficient documentation

## 2013-02-16 DIAGNOSIS — J189 Pneumonia, unspecified organism: Secondary | ICD-10-CM

## 2013-02-16 DIAGNOSIS — I4891 Unspecified atrial fibrillation: Secondary | ICD-10-CM | POA: Insufficient documentation

## 2013-02-16 DIAGNOSIS — Z7901 Long term (current) use of anticoagulants: Secondary | ICD-10-CM | POA: Insufficient documentation

## 2013-02-16 DIAGNOSIS — E785 Hyperlipidemia, unspecified: Secondary | ICD-10-CM | POA: Insufficient documentation

## 2013-02-16 DIAGNOSIS — I10 Essential (primary) hypertension: Secondary | ICD-10-CM | POA: Insufficient documentation

## 2013-02-16 DIAGNOSIS — Z79899 Other long term (current) drug therapy: Secondary | ICD-10-CM | POA: Insufficient documentation

## 2013-02-16 DIAGNOSIS — I69959 Hemiplegia and hemiparesis following unspecified cerebrovascular disease affecting unspecified side: Secondary | ICD-10-CM | POA: Insufficient documentation

## 2013-02-16 DIAGNOSIS — R5381 Other malaise: Secondary | ICD-10-CM | POA: Insufficient documentation

## 2013-02-16 HISTORY — DX: Hyperlipidemia, unspecified: E78.5

## 2013-02-16 HISTORY — DX: Essential (primary) hypertension: I10

## 2013-02-16 HISTORY — DX: Unspecified atrial fibrillation: I48.91

## 2013-02-16 LAB — URINE MICROSCOPIC-ADD ON

## 2013-02-16 LAB — CBC WITH DIFFERENTIAL/PLATELET
Hemoglobin: 14.8 g/dL (ref 12.0–15.0)
Lymphocytes Relative: 7 % — ABNORMAL LOW (ref 12–46)
Lymphs Abs: 1.1 10*3/uL (ref 0.7–4.0)
Monocytes Relative: 6 % (ref 3–12)
Neutro Abs: 14.7 10*3/uL — ABNORMAL HIGH (ref 1.7–7.7)
Neutrophils Relative %: 87 % — ABNORMAL HIGH (ref 43–77)
RBC: 4.63 MIL/uL (ref 3.87–5.11)
WBC: 16.9 10*3/uL — ABNORMAL HIGH (ref 4.0–10.5)

## 2013-02-16 LAB — COMPREHENSIVE METABOLIC PANEL
ALT: 70 U/L — ABNORMAL HIGH (ref 0–35)
Albumin: 3.1 g/dL — ABNORMAL LOW (ref 3.5–5.2)
Alkaline Phosphatase: 102 U/L (ref 39–117)
BUN: 21 mg/dL (ref 6–23)
Chloride: 95 mEq/L — ABNORMAL LOW (ref 96–112)
GFR calc Af Amer: 90 mL/min — ABNORMAL LOW (ref >90)
Glucose, Bld: 120 mg/dL — ABNORMAL HIGH (ref 70–99)
Potassium: 3.8 mEq/L (ref 3.5–5.1)
Total Bilirubin: 1 mg/dL (ref 0.3–1.2)

## 2013-02-16 LAB — URINALYSIS, ROUTINE W REFLEX MICROSCOPIC
Glucose, UA: NEGATIVE mg/dL
Ketones, ur: NEGATIVE mg/dL
Protein, ur: NEGATIVE mg/dL

## 2013-02-16 MED ORDER — LEVOFLOXACIN 750 MG PO TABS
750.0000 mg | ORAL_TABLET | Freq: Once | ORAL | Status: AC
  Administered 2013-02-17: 750 mg via ORAL
  Filled 2013-02-16: qty 1

## 2013-02-16 MED ORDER — CEFTRIAXONE SODIUM 1 G IJ SOLR
1.0000 g | Freq: Once | INTRAMUSCULAR | Status: AC
  Administered 2013-02-17: 1 g via INTRAMUSCULAR
  Filled 2013-02-16: qty 10

## 2013-02-16 MED ORDER — MOXIFLOXACIN HCL 400 MG PO TABS: 400.0000 mg | ORAL_TABLET | Freq: Every day | ORAL | Status: AC

## 2013-02-16 MED ORDER — MOXIFLOXACIN HCL 400 MG PO TABS: 400.0000 mg | ORAL_TABLET | Freq: Every day | ORAL | Status: DC

## 2013-02-16 NOTE — ED Provider Notes (Signed)
CSN: [id number]     Arrival date & time 02/16/13  1922 History     First MD Initiated Contact with Patient 02/16/13 2159     No chief complaint on file.  (Consider location/radiation/quality/duration/timing/severity/associated sxs/prior Treatment) HPI 79 year old female with history of stroke with residual right-sided deficits, hypertension, atrial fibrillation, hyperlipidemia presents with family from assisted living facility for concern of leukocytosis found on PCP CBC.  Family reports that since July 24 she has had a cough, and "gurgling." Reports that at times she was told that she had fluid on her lungs and had repeat x-ray as well as echocardiogram. Son reports that she has continued to have a cough, and over the last 2 weeks has increasing fatigue, his appetite. Reports she has barely Wednesday anything for the last 2 weeks, and has lost nearly 10 pounds. Reports she was previously quite active despite having right-sided weakness and was walking around the assisted living facility, however over the last 2 weeks she has not wanting to do anything, has lost motivation , and has wanted to stay in bed. Temperature at the facility of 99.1. Patient denies any concerns, and states she wants to go home.  Other than nonproductive cough, patient and family deny any abdominal pain, nausea, vomiting, diarrhea, blood in the stool, dysuria.  The patient does note occasional headache, however denies headache at this time. Family does report facility holding coumadin, and per facility notes patient's INR was greater than 10.  Past Medical History  Diagnosis Date  . Stroke   . Shortness of breath   . Hypertension   . Atrial fibrillation   . Hyperlipidemia    History reviewed. No pertinent past surgical history. No family history on file. History  Substance Use Topics  . Smoking status: Never Smoker   . Smokeless tobacco: Not on file  . Alcohol Use: No   OB History   Grav Para Term Preterm  Abortions TAB SAB Ect Mult Living                 Review of Systems  Constitutional: Positive for appetite change and fatigue. Negative for fever and chills.  HENT: Positive for voice change (hoarseness x weeks). Negative for sore throat and neck pain.   Eyes: Negative for visual disturbance.  Respiratory: Negative for cough and shortness of breath.   Cardiovascular: Negative for chest pain.  Gastrointestinal: Negative for nausea, vomiting, abdominal pain and diarrhea.  Genitourinary: Negative for dysuria and difficulty urinating.  Musculoskeletal: Negative for back pain.  Skin: Negative for rash.  Neurological: Positive for weakness (generalized) and headaches. Negative for syncope, facial asymmetry, speech difficulty and numbness.    Allergies  Aspirin  Home Medications   Current Outpatient Rx  Name  Route  Sig  Dispense  Refill  . calcium-vitamin D (OSCAL WITH D) 500-200 MG-UNIT per tablet   Oral   Take 1 tablet by mouth 2 (two) times daily.         . Cholecalciferol (VITAMIN D) 2000 UNITS CAPS   Oral   Take 1 capsule by mouth every morning.         . furosemide (LASIX) 40 MG tablet   Oral   Take 40 mg by mouth.         . metoprolol succinate (TOPROL-XL) 25 MG 24 hr tablet   Oral   Take 25 mg by mouth daily.         . sertraline (ZOLOFT) 100 MG tablet   Oral  Take 100 mg by mouth every morning.         . simvastatin (ZOCOR) 40 MG tablet   Oral   Take 40 mg by mouth at bedtime.         Marland Kitchen warfarin (COUMADIN) 3 MG tablet   Oral   Take 3 mg by mouth daily.         Marland Kitchen moxifloxacin (AVELOX) 400 MG tablet   Oral   Take 1 tablet (400 mg total) by mouth daily.   10 tablet   0    BP 154/134  Pulse 48  Temp(Src) 98.5 F (36.9 C) (Oral)  Resp 18  SpO2 97% Physical Exam  Nursing note and vitals reviewed. Constitutional: She is oriented to person, place, and time. She appears well-developed and well-nourished. No distress.  HENT:  Head:  Normocephalic and atraumatic.  Mouth/Throat: Oropharynx is clear and moist. No oropharyngeal exudate.  Eyes: Conjunctivae and EOM are normal.  Neck: Normal range of motion.  Cardiovascular: Normal rate, regular rhythm, normal heart sounds and intact distal pulses.  Exam reveals no gallop and no friction rub.   No murmur heard. Pulmonary/Chest: Effort normal and breath sounds normal. No respiratory distress. She has no wheezes. She has no rales.  Abdominal: Soft. She exhibits no distension. There is no tenderness. There is no guarding.  Musculoskeletal: She exhibits no edema and no tenderness.  Neurological: She is alert and oriented to person, place, and time. She has normal strength. No cranial nerve deficit or sensory deficit. Coordination normal. GCS eye subscore is 4. GCS verbal subscore is 5. GCS motor subscore is 6.  Skin: Skin is warm and dry. No rash noted. She is not diaphoretic. No erythema.    ED Course   Procedures (including critical care time)  Labs Reviewed  CBC WITH DIFFERENTIAL - Abnormal; Notable for the following:    WBC 16.9 (*)    MCHC 36.3 (*)    Neutrophils Relative % 87 (*)    Neutro Abs 14.7 (*)    Lymphocytes Relative 7 (*)    All other components within normal limits  COMPREHENSIVE METABOLIC PANEL - Abnormal; Notable for the following:    Sodium 133 (*)    Chloride 95 (*)    Glucose, Bld 120 (*)    Albumin 3.1 (*)    AST 55 (*)    ALT 70 (*)    GFR calc non Af Amer 77 (*)    GFR calc Af Amer 90 (*)    All other components within normal limits  URINALYSIS, ROUTINE W REFLEX MICROSCOPIC - Abnormal; Notable for the following:    Bilirubin Urine SMALL (*)    Urobilinogen, UA 2.0 (*)    Leukocytes, UA SMALL (*)    All other components within normal limits  PROTIME-INR - Abnormal; Notable for the following:    Prothrombin Time 24.4 (*)    INR 2.28 (*)    All other components within normal limits  URINE MICROSCOPIC-ADD ON - Abnormal; Notable for the  following:    Casts HYALINE CASTS (*)    All other components within normal limits  CG4 I-STAT (LACTIC ACID)   Dg Chest 2 View  02/16/2013   *RADIOLOGY REPORT*  Clinical Data: Cough  CHEST - 2 VIEW  Comparison: Prior radiograph from 01/26/2013  Findings: The cardiac and mediastinal silhouettes are within stable in size and contour, and remain within normal limits.  The lungs are normally inflated.  There are patchy bibasilar airspace opacities,  left greater than right, which may reflect possible infectious infiltrates. These are new as compared to the prior exam.  There is no pulmonary edema or pleural effusion.  No pneumothorax.  No acute osseous abnormality.  IMPRESSION: Interval development of patchy bibasilar airspace opacities, left greater than right, worrisome for possible infectious pneumonitis.   Original Report Authenticated By: Rise Mu, M.D.    Date: 02/17/2013  Rate: 97  Rhythm: atrial fibrillation  QRS Axis: normal  Intervals: normal-machine reads QTc, however unclear with atrial fibrillation  ST/T Wave abnormalities: nonspecific T wave changes  Conduction Disutrbances:none  Narrative Interpretation:   Old EKG Reviewed: unchanged   1. HCAP (healthcare-associated pneumonia), secondary to patient in assisted living, Patient and family desire discharge.     MDM  79 year old female with history of stroke with residual right-sided deficits, hypertension, atrial fibrillation, hyperlipidemia presents with family from assisted living facility at request of PCP for concern of leukocytosis.  Patient afebrile, in atrial fibrillation with a rate in the 90s, and normotensive on initial ED evaluation, with hypertension noted during patient's stay.  Other than cough, and generalized weakness patient denies localizing symptoms, and denies current headache.  Patient with leukocytosis and left shift with white blood cell count 16.9. Urinalysis shows no signs of infection. Chest x-ray  shows left lower lobe infiltrates, as well as possible right lower lobe infiltrate consistent with pneumonia.  Had initially ordered a head CT today and patient INR greater than 10, and reported intermittent headaches along with generalized weakness, however patient and family request this not be performed and and given patient with pneumonia have low suspicion for intracranial bleed as cause of patient's symptoms. CMP reveals mild transaminitis with AST and ALT of 55 and 70 and a creatinine within normal limits. INR at this time is therapeutic at 2.28. Lactic acid is 2.08. Discussed results of chest x-ray showing pneumonia, and patient risk factors for healthcare associated pneumonia being the patient lives in an assisted living facility.  Recommended that patient be admitted for intravenous antibiotics for HCAP, however patient and family desire discharged back to patient's assisted living facility. Discussed risks in depth of patient receiving oral antibiotics rather than intravenous antibiotics indicated for a healthcare associated pneumonia with patient and family. Her son is patient's power of attorney, however he reports responding to her requests. Patient alert and oriented and adamantly does not want to be admitted to the hospital.  Son states understanding of risks of discharge.  30 days of intravenous antibiotics prior to discharge, however patient's family also refused this and he does have intramuscular ceftriaxone and oral Levaquin was ordered. Patient will be discharged on Avelox 400 mg for 10 days and recommended very close monitoring if patient's symptoms, vital signs, and mental status as well as close outpatient followup with her doctor.  Provided strict return precautions.  Also discussed that patient is hypertensive in the emergency dept which also may be indication for admission.  Patient provided 5mg  amlodipine.       Rhae Lerner, MD 02/17/13 0030

## 2013-02-16 NOTE — ED Notes (Signed)
Pt. returned from XR. 

## 2013-02-16 NOTE — ED Notes (Signed)
Paged CT for transport

## 2013-02-16 NOTE — ED Notes (Signed)
CT cancelled. Pt informed.

## 2013-02-16 NOTE — ED Notes (Signed)
FAMILY REPORTED THAT HER WBC IS ELEVATED , BLOOD TEST DONE AT [facility] OFFICE AND [facility] YESTERDAY , RESPIRATIONS UNLABORED .

## 2013-02-16 NOTE — ED Notes (Signed)
Pt ambulated to restroom, but was unable to give urine sample.

## 2013-02-16 NOTE — ED Notes (Signed)
Pt transported to XR.  

## 2013-02-17 MED ORDER — LIDOCAINE HCL (PF) 1 % IJ SOLN
INTRAMUSCULAR | Status: AC
  Administered 2013-02-17: 2.1 mL
  Filled 2013-02-17: qty 5

## 2013-02-17 MED ORDER — AMLODIPINE BESYLATE 5 MG PO TABS
5.0000 mg | ORAL_TABLET | Freq: Once | ORAL | Status: AC
  Administered 2013-02-17: 5 mg via ORAL
  Filled 2013-02-17: qty 1

## 2013-02-17 NOTE — ED Notes (Signed)
Pt family driving pt back to her facility

## 2013-02-18 NOTE — ED Provider Notes (Signed)
I saw and evaluated the patient, reviewed the resident's note and I agree with the findings and plan.   .Face to face Exam:  General:  Awake HEENT:  Atraumatic Resp:  Normal effort Abd:  Nondistended Neuro:No focal weakness Lymph: No adenopathy  Nelia Shi, MD 02/18/13 1218

## 2015-08-15 DIAGNOSIS — Z7901 Long term (current) use of anticoagulants: Secondary | ICD-10-CM | POA: Diagnosis not present

## 2015-09-12 DIAGNOSIS — Z7901 Long term (current) use of anticoagulants: Secondary | ICD-10-CM | POA: Diagnosis not present

## 2015-10-10 DIAGNOSIS — Z7901 Long term (current) use of anticoagulants: Secondary | ICD-10-CM | POA: Diagnosis not present

## 2015-10-18 DIAGNOSIS — M25579 Pain in unspecified ankle and joints of unspecified foot: Secondary | ICD-10-CM | POA: Diagnosis not present

## 2015-11-13 DIAGNOSIS — Z7901 Long term (current) use of anticoagulants: Secondary | ICD-10-CM | POA: Diagnosis not present

## 2015-12-11 DIAGNOSIS — Z7901 Long term (current) use of anticoagulants: Secondary | ICD-10-CM | POA: Diagnosis not present

## 2015-12-15 DIAGNOSIS — Z7901 Long term (current) use of anticoagulants: Secondary | ICD-10-CM | POA: Diagnosis not present

## 2015-12-15 DIAGNOSIS — I481 Persistent atrial fibrillation: Secondary | ICD-10-CM | POA: Diagnosis not present

## 2015-12-15 DIAGNOSIS — J181 Lobar pneumonia, unspecified organism: Secondary | ICD-10-CM | POA: Diagnosis not present

## 2015-12-19 DIAGNOSIS — Z7901 Long term (current) use of anticoagulants: Secondary | ICD-10-CM | POA: Diagnosis not present

## 2015-12-21 DIAGNOSIS — Z7901 Long term (current) use of anticoagulants: Secondary | ICD-10-CM | POA: Diagnosis not present

## 2015-12-25 DIAGNOSIS — Z7901 Long term (current) use of anticoagulants: Secondary | ICD-10-CM | POA: Diagnosis not present

## 2016-01-08 DIAGNOSIS — Z7901 Long term (current) use of anticoagulants: Secondary | ICD-10-CM | POA: Diagnosis not present

## 2016-01-15 DIAGNOSIS — Z7901 Long term (current) use of anticoagulants: Secondary | ICD-10-CM | POA: Diagnosis not present

## 2016-01-22 DIAGNOSIS — Z7901 Long term (current) use of anticoagulants: Secondary | ICD-10-CM | POA: Diagnosis not present

## 2016-02-06 DIAGNOSIS — Z7901 Long term (current) use of anticoagulants: Secondary | ICD-10-CM | POA: Diagnosis not present

## 2016-03-05 DIAGNOSIS — Z7901 Long term (current) use of anticoagulants: Secondary | ICD-10-CM | POA: Diagnosis not present

## 2016-04-03 DIAGNOSIS — Z7901 Long term (current) use of anticoagulants: Secondary | ICD-10-CM | POA: Diagnosis not present

## 2016-08-13 DIAGNOSIS — Z7901 Long term (current) use of anticoagulants: Secondary | ICD-10-CM | POA: Diagnosis not present

## 2016-09-17 DIAGNOSIS — Z7901 Long term (current) use of anticoagulants: Secondary | ICD-10-CM | POA: Diagnosis not present

## 2016-10-22 DIAGNOSIS — Z7901 Long term (current) use of anticoagulants: Secondary | ICD-10-CM | POA: Diagnosis not present

## 2016-10-29 DIAGNOSIS — Z7901 Long term (current) use of anticoagulants: Secondary | ICD-10-CM | POA: Diagnosis not present

## 2016-12-09 DIAGNOSIS — Z7901 Long term (current) use of anticoagulants: Secondary | ICD-10-CM | POA: Diagnosis not present

## 2017-01-06 DIAGNOSIS — Z7901 Long term (current) use of anticoagulants: Secondary | ICD-10-CM | POA: Diagnosis not present

## 2017-01-13 DIAGNOSIS — Z7901 Long term (current) use of anticoagulants: Secondary | ICD-10-CM | POA: Diagnosis not present

## 2017-02-10 DIAGNOSIS — Z7901 Long term (current) use of anticoagulants: Secondary | ICD-10-CM | POA: Diagnosis not present

## 2017-03-17 DIAGNOSIS — F321 Major depressive disorder, single episode, moderate: Secondary | ICD-10-CM | POA: Diagnosis not present

## 2017-03-17 DIAGNOSIS — E7800 Pure hypercholesterolemia, unspecified: Secondary | ICD-10-CM | POA: Diagnosis not present

## 2017-03-17 DIAGNOSIS — I6930 Unspecified sequelae of cerebral infarction: Secondary | ICD-10-CM | POA: Diagnosis not present

## 2017-03-17 DIAGNOSIS — Z7901 Long term (current) use of anticoagulants: Secondary | ICD-10-CM | POA: Diagnosis not present

## 2017-04-14 DIAGNOSIS — Z7901 Long term (current) use of anticoagulants: Secondary | ICD-10-CM | POA: Diagnosis not present

## 2017-05-12 DIAGNOSIS — Z7901 Long term (current) use of anticoagulants: Secondary | ICD-10-CM | POA: Diagnosis not present

## 2017-06-11 DIAGNOSIS — Z7901 Long term (current) use of anticoagulants: Secondary | ICD-10-CM | POA: Diagnosis not present

## 2017-07-09 DIAGNOSIS — Z7901 Long term (current) use of anticoagulants: Secondary | ICD-10-CM | POA: Diagnosis not present

## 2017-07-28 DIAGNOSIS — L509 Urticaria, unspecified: Secondary | ICD-10-CM | POA: Diagnosis not present

## 2017-07-28 DIAGNOSIS — Z7901 Long term (current) use of anticoagulants: Secondary | ICD-10-CM | POA: Diagnosis not present

## 2017-07-28 DIAGNOSIS — I481 Persistent atrial fibrillation: Secondary | ICD-10-CM | POA: Diagnosis not present

## 2017-07-28 DIAGNOSIS — R21 Rash and other nonspecific skin eruption: Secondary | ICD-10-CM | POA: Diagnosis not present

## 2017-08-25 DIAGNOSIS — Z7901 Long term (current) use of anticoagulants: Secondary | ICD-10-CM | POA: Diagnosis not present

## 2017-09-22 DIAGNOSIS — Z7901 Long term (current) use of anticoagulants: Secondary | ICD-10-CM | POA: Diagnosis not present

## 2017-09-24 DIAGNOSIS — M79632 Pain in left forearm: Secondary | ICD-10-CM | POA: Diagnosis not present

## 2017-10-06 DIAGNOSIS — Z7901 Long term (current) use of anticoagulants: Secondary | ICD-10-CM | POA: Diagnosis not present

## 2017-10-15 DIAGNOSIS — Z7901 Long term (current) use of anticoagulants: Secondary | ICD-10-CM | POA: Diagnosis not present

## 2017-10-15 DIAGNOSIS — L309 Dermatitis, unspecified: Secondary | ICD-10-CM | POA: Diagnosis not present

## 2017-10-27 DIAGNOSIS — Z7901 Long term (current) use of anticoagulants: Secondary | ICD-10-CM | POA: Diagnosis not present

## 2017-11-10 DIAGNOSIS — Z7901 Long term (current) use of anticoagulants: Secondary | ICD-10-CM | POA: Diagnosis not present

## 2017-12-09 DIAGNOSIS — Z7901 Long term (current) use of anticoagulants: Secondary | ICD-10-CM | POA: Diagnosis not present

## 2017-12-16 DIAGNOSIS — Z7901 Long term (current) use of anticoagulants: Secondary | ICD-10-CM | POA: Diagnosis not present

## 2017-12-22 DIAGNOSIS — Z7901 Long term (current) use of anticoagulants: Secondary | ICD-10-CM | POA: Diagnosis not present

## 2017-12-26 DIAGNOSIS — Z7901 Long term (current) use of anticoagulants: Secondary | ICD-10-CM | POA: Diagnosis not present

## 2017-12-31 DIAGNOSIS — Z7901 Long term (current) use of anticoagulants: Secondary | ICD-10-CM | POA: Diagnosis not present

## 2018-01-02 DIAGNOSIS — Z7901 Long term (current) use of anticoagulants: Secondary | ICD-10-CM | POA: Diagnosis not present

## 2018-01-05 DIAGNOSIS — Z7901 Long term (current) use of anticoagulants: Secondary | ICD-10-CM | POA: Diagnosis not present

## 2018-01-09 DIAGNOSIS — Z7901 Long term (current) use of anticoagulants: Secondary | ICD-10-CM | POA: Diagnosis not present

## 2018-01-15 DIAGNOSIS — Z7901 Long term (current) use of anticoagulants: Secondary | ICD-10-CM | POA: Diagnosis not present

## 2018-01-22 DIAGNOSIS — Z7901 Long term (current) use of anticoagulants: Secondary | ICD-10-CM | POA: Diagnosis not present

## 2018-02-05 DIAGNOSIS — Z7901 Long term (current) use of anticoagulants: Secondary | ICD-10-CM | POA: Diagnosis not present

## 2018-02-18 DIAGNOSIS — Z7901 Long term (current) use of anticoagulants: Secondary | ICD-10-CM | POA: Diagnosis not present

## 2018-03-04 DIAGNOSIS — Z7901 Long term (current) use of anticoagulants: Secondary | ICD-10-CM | POA: Diagnosis not present

## 2018-03-26 DIAGNOSIS — E7800 Pure hypercholesterolemia, unspecified: Secondary | ICD-10-CM | POA: Diagnosis not present

## 2018-03-26 DIAGNOSIS — Z0001 Encounter for general adult medical examination with abnormal findings: Secondary | ICD-10-CM | POA: Diagnosis not present

## 2018-03-26 DIAGNOSIS — I481 Persistent atrial fibrillation: Secondary | ICD-10-CM | POA: Diagnosis not present

## 2018-03-26 DIAGNOSIS — F321 Major depressive disorder, single episode, moderate: Secondary | ICD-10-CM | POA: Diagnosis not present

## 2018-04-02 DIAGNOSIS — Z7901 Long term (current) use of anticoagulants: Secondary | ICD-10-CM | POA: Diagnosis not present

## 2018-04-13 DIAGNOSIS — Z7901 Long term (current) use of anticoagulants: Secondary | ICD-10-CM | POA: Diagnosis not present

## 2018-04-28 DIAGNOSIS — Z7901 Long term (current) use of anticoagulants: Secondary | ICD-10-CM | POA: Diagnosis not present

## 2018-05-25 DIAGNOSIS — Z7901 Long term (current) use of anticoagulants: Secondary | ICD-10-CM | POA: Diagnosis not present

## 2018-06-09 DIAGNOSIS — Z7901 Long term (current) use of anticoagulants: Secondary | ICD-10-CM | POA: Diagnosis not present

## 2018-06-23 DIAGNOSIS — Z7901 Long term (current) use of anticoagulants: Secondary | ICD-10-CM | POA: Diagnosis not present

## 2018-07-07 DIAGNOSIS — Z7901 Long term (current) use of anticoagulants: Secondary | ICD-10-CM | POA: Diagnosis not present

## 2018-07-21 DIAGNOSIS — Z7901 Long term (current) use of anticoagulants: Secondary | ICD-10-CM | POA: Diagnosis not present

## 2018-08-11 DIAGNOSIS — Z7901 Long term (current) use of anticoagulants: Secondary | ICD-10-CM | POA: Diagnosis not present

## 2018-08-18 DIAGNOSIS — Z7901 Long term (current) use of anticoagulants: Secondary | ICD-10-CM | POA: Diagnosis not present

## 2018-09-01 DIAGNOSIS — Z7901 Long term (current) use of anticoagulants: Secondary | ICD-10-CM | POA: Diagnosis not present

## 2018-09-08 DIAGNOSIS — Z7901 Long term (current) use of anticoagulants: Secondary | ICD-10-CM | POA: Diagnosis not present

## 2018-09-22 DIAGNOSIS — Z7901 Long term (current) use of anticoagulants: Secondary | ICD-10-CM | POA: Diagnosis not present

## 2018-10-05 DIAGNOSIS — Z5181 Encounter for therapeutic drug level monitoring: Secondary | ICD-10-CM | POA: Diagnosis not present

## 2018-10-05 DIAGNOSIS — I4819 Other persistent atrial fibrillation: Secondary | ICD-10-CM | POA: Diagnosis not present

## 2018-10-05 DIAGNOSIS — I69351 Hemiplegia and hemiparesis following cerebral infarction affecting right dominant side: Secondary | ICD-10-CM | POA: Diagnosis not present

## 2018-10-05 DIAGNOSIS — I10 Essential (primary) hypertension: Secondary | ICD-10-CM | POA: Diagnosis not present

## 2018-10-05 DIAGNOSIS — Z7901 Long term (current) use of anticoagulants: Secondary | ICD-10-CM | POA: Diagnosis not present

## 2018-10-05 DIAGNOSIS — I69320 Aphasia following cerebral infarction: Secondary | ICD-10-CM | POA: Diagnosis not present

## 2018-11-04 DIAGNOSIS — I69320 Aphasia following cerebral infarction: Secondary | ICD-10-CM | POA: Diagnosis not present

## 2018-11-04 DIAGNOSIS — I10 Essential (primary) hypertension: Secondary | ICD-10-CM | POA: Diagnosis not present

## 2018-11-04 DIAGNOSIS — Z7901 Long term (current) use of anticoagulants: Secondary | ICD-10-CM | POA: Diagnosis not present

## 2018-11-04 DIAGNOSIS — I69351 Hemiplegia and hemiparesis following cerebral infarction affecting right dominant side: Secondary | ICD-10-CM | POA: Diagnosis not present

## 2018-11-04 DIAGNOSIS — Z5181 Encounter for therapeutic drug level monitoring: Secondary | ICD-10-CM | POA: Diagnosis not present

## 2018-11-04 DIAGNOSIS — I4819 Other persistent atrial fibrillation: Secondary | ICD-10-CM | POA: Diagnosis not present

## 2018-12-04 DIAGNOSIS — I69351 Hemiplegia and hemiparesis following cerebral infarction affecting right dominant side: Secondary | ICD-10-CM | POA: Diagnosis not present

## 2018-12-04 DIAGNOSIS — Z7901 Long term (current) use of anticoagulants: Secondary | ICD-10-CM | POA: Diagnosis not present

## 2018-12-04 DIAGNOSIS — I10 Essential (primary) hypertension: Secondary | ICD-10-CM | POA: Diagnosis not present

## 2018-12-04 DIAGNOSIS — I69320 Aphasia following cerebral infarction: Secondary | ICD-10-CM | POA: Diagnosis not present

## 2018-12-09 DIAGNOSIS — I69320 Aphasia following cerebral infarction: Secondary | ICD-10-CM | POA: Diagnosis not present

## 2018-12-09 DIAGNOSIS — Z5181 Encounter for therapeutic drug level monitoring: Secondary | ICD-10-CM | POA: Diagnosis not present

## 2018-12-09 DIAGNOSIS — I4819 Other persistent atrial fibrillation: Secondary | ICD-10-CM | POA: Diagnosis not present

## 2018-12-09 DIAGNOSIS — Z7901 Long term (current) use of anticoagulants: Secondary | ICD-10-CM | POA: Diagnosis not present

## 2018-12-09 DIAGNOSIS — I10 Essential (primary) hypertension: Secondary | ICD-10-CM | POA: Diagnosis not present

## 2018-12-09 DIAGNOSIS — I69351 Hemiplegia and hemiparesis following cerebral infarction affecting right dominant side: Secondary | ICD-10-CM | POA: Diagnosis not present

## 2019-01-08 DIAGNOSIS — I69320 Aphasia following cerebral infarction: Secondary | ICD-10-CM | POA: Diagnosis not present

## 2019-01-08 DIAGNOSIS — Z5181 Encounter for therapeutic drug level monitoring: Secondary | ICD-10-CM | POA: Diagnosis not present

## 2019-01-08 DIAGNOSIS — I4819 Other persistent atrial fibrillation: Secondary | ICD-10-CM | POA: Diagnosis not present

## 2019-01-08 DIAGNOSIS — I10 Essential (primary) hypertension: Secondary | ICD-10-CM | POA: Diagnosis not present

## 2019-01-08 DIAGNOSIS — Z7901 Long term (current) use of anticoagulants: Secondary | ICD-10-CM | POA: Diagnosis not present

## 2019-01-08 DIAGNOSIS — I69351 Hemiplegia and hemiparesis following cerebral infarction affecting right dominant side: Secondary | ICD-10-CM | POA: Diagnosis not present

## 2019-01-12 DIAGNOSIS — I10 Essential (primary) hypertension: Secondary | ICD-10-CM | POA: Diagnosis not present

## 2019-04-13 DIAGNOSIS — E7800 Pure hypercholesterolemia, unspecified: Secondary | ICD-10-CM | POA: Diagnosis not present

## 2019-04-13 DIAGNOSIS — R4689 Other symptoms and signs involving appearance and behavior: Secondary | ICD-10-CM | POA: Diagnosis not present

## 2019-04-13 DIAGNOSIS — Z7901 Long term (current) use of anticoagulants: Secondary | ICD-10-CM | POA: Diagnosis not present

## 2019-04-13 DIAGNOSIS — F321 Major depressive disorder, single episode, moderate: Secondary | ICD-10-CM | POA: Diagnosis not present

## 2019-08-18 DIAGNOSIS — M79673 Pain in unspecified foot: Secondary | ICD-10-CM | POA: Diagnosis not present

## 2019-08-18 DIAGNOSIS — L603 Nail dystrophy: Secondary | ICD-10-CM | POA: Diagnosis not present

## 2019-08-18 DIAGNOSIS — M19079 Primary osteoarthritis, unspecified ankle and foot: Secondary | ICD-10-CM | POA: Diagnosis not present

## 2019-08-18 DIAGNOSIS — B351 Tinea unguium: Secondary | ICD-10-CM | POA: Diagnosis not present

## 2019-12-02 DIAGNOSIS — M79673 Pain in unspecified foot: Secondary | ICD-10-CM | POA: Diagnosis not present

## 2019-12-02 DIAGNOSIS — M19079 Primary osteoarthritis, unspecified ankle and foot: Secondary | ICD-10-CM | POA: Diagnosis not present

## 2019-12-02 DIAGNOSIS — L851 Acquired keratosis [keratoderma] palmaris et plantaris: Secondary | ICD-10-CM | POA: Diagnosis not present

## 2019-12-02 DIAGNOSIS — B351 Tinea unguium: Secondary | ICD-10-CM | POA: Diagnosis not present

## 2020-02-03 DIAGNOSIS — M79673 Pain in unspecified foot: Secondary | ICD-10-CM | POA: Diagnosis not present

## 2020-02-03 DIAGNOSIS — B351 Tinea unguium: Secondary | ICD-10-CM | POA: Diagnosis not present

## 2020-02-03 DIAGNOSIS — M19079 Primary osteoarthritis, unspecified ankle and foot: Secondary | ICD-10-CM | POA: Diagnosis not present

## 2020-02-03 DIAGNOSIS — L84 Corns and callosities: Secondary | ICD-10-CM | POA: Diagnosis not present

## 2020-04-26 DIAGNOSIS — M19079 Primary osteoarthritis, unspecified ankle and foot: Secondary | ICD-10-CM | POA: Diagnosis not present

## 2020-04-26 DIAGNOSIS — L605 Yellow nail syndrome: Secondary | ICD-10-CM | POA: Diagnosis not present

## 2020-04-26 DIAGNOSIS — B351 Tinea unguium: Secondary | ICD-10-CM | POA: Diagnosis not present

## 2020-04-26 DIAGNOSIS — M79673 Pain in unspecified foot: Secondary | ICD-10-CM | POA: Diagnosis not present

## 2020-06-15 DIAGNOSIS — I4891 Unspecified atrial fibrillation: Secondary | ICD-10-CM | POA: Diagnosis not present

## 2020-06-15 DIAGNOSIS — F321 Major depressive disorder, single episode, moderate: Secondary | ICD-10-CM | POA: Diagnosis not present

## 2020-06-15 DIAGNOSIS — E7800 Pure hypercholesterolemia, unspecified: Secondary | ICD-10-CM | POA: Diagnosis not present

## 2020-06-15 DIAGNOSIS — Z7901 Long term (current) use of anticoagulants: Secondary | ICD-10-CM | POA: Diagnosis not present

## 2020-06-29 DIAGNOSIS — B351 Tinea unguium: Secondary | ICD-10-CM | POA: Diagnosis not present

## 2020-06-29 DIAGNOSIS — M19079 Primary osteoarthritis, unspecified ankle and foot: Secondary | ICD-10-CM | POA: Diagnosis not present

## 2020-06-29 DIAGNOSIS — L851 Acquired keratosis [keratoderma] palmaris et plantaris: Secondary | ICD-10-CM | POA: Diagnosis not present

## 2020-06-29 DIAGNOSIS — M79673 Pain in unspecified foot: Secondary | ICD-10-CM | POA: Diagnosis not present

## 2020-09-04 DIAGNOSIS — I4891 Unspecified atrial fibrillation: Secondary | ICD-10-CM | POA: Diagnosis not present

## 2020-09-04 DIAGNOSIS — I6930 Unspecified sequelae of cerebral infarction: Secondary | ICD-10-CM | POA: Diagnosis not present

## 2020-09-04 DIAGNOSIS — E7800 Pure hypercholesterolemia, unspecified: Secondary | ICD-10-CM | POA: Diagnosis not present

## 2020-09-04 DIAGNOSIS — Z0001 Encounter for general adult medical examination with abnormal findings: Secondary | ICD-10-CM | POA: Diagnosis not present

## 2020-10-17 DIAGNOSIS — M79673 Pain in unspecified foot: Secondary | ICD-10-CM | POA: Diagnosis not present

## 2020-10-17 DIAGNOSIS — L84 Corns and callosities: Secondary | ICD-10-CM | POA: Diagnosis not present

## 2020-10-17 DIAGNOSIS — M19079 Primary osteoarthritis, unspecified ankle and foot: Secondary | ICD-10-CM | POA: Diagnosis not present

## 2020-10-17 DIAGNOSIS — B351 Tinea unguium: Secondary | ICD-10-CM | POA: Diagnosis not present

## 2020-12-16 ENCOUNTER — Other Ambulatory Visit: Payer: Self-pay

## 2020-12-16 ENCOUNTER — Emergency Department (HOSPITAL_COMMUNITY)
Admission: EM | Admit: 2020-12-16 | Discharge: 2020-12-16 | Disposition: A | Discharge status: Home / Self Care | Payer: Medicare Other | Attending: Emergency Medicine | Admitting: Emergency Medicine

## 2020-12-16 ENCOUNTER — Emergency Department (HOSPITAL_COMMUNITY): Payer: Medicare Other

## 2020-12-16 ENCOUNTER — Encounter (HOSPITAL_COMMUNITY): Payer: Self-pay

## 2020-12-16 ENCOUNTER — Encounter (HOSPITAL_COMMUNITY): Payer: Self-pay | Admitting: *Deleted

## 2020-12-16 DIAGNOSIS — I10 Essential (primary) hypertension: Secondary | ICD-10-CM | POA: Diagnosis not present

## 2020-12-16 DIAGNOSIS — I639 Cerebral infarction, unspecified: Secondary | ICD-10-CM | POA: Insufficient documentation

## 2020-12-16 DIAGNOSIS — R5381 Other malaise: Secondary | ICD-10-CM | POA: Diagnosis not present

## 2020-12-16 DIAGNOSIS — R410 Disorientation, unspecified: Secondary | ICD-10-CM

## 2020-12-16 DIAGNOSIS — R4182 Altered mental status, unspecified: Secondary | ICD-10-CM | POA: Diagnosis not present

## 2020-12-16 DIAGNOSIS — I4891 Unspecified atrial fibrillation: Secondary | ICD-10-CM | POA: Diagnosis not present

## 2020-12-16 DIAGNOSIS — F29 Unspecified psychosis not due to a substance or known physiological condition: Secondary | ICD-10-CM | POA: Diagnosis not present

## 2020-12-16 DIAGNOSIS — Z79899 Other long term (current) drug therapy: Secondary | ICD-10-CM | POA: Diagnosis not present

## 2020-12-16 DIAGNOSIS — Z7901 Long term (current) use of anticoagulants: Secondary | ICD-10-CM | POA: Diagnosis not present

## 2020-12-16 DIAGNOSIS — Y900 Blood alcohol level of less than 20 mg/100 ml: Secondary | ICD-10-CM | POA: Insufficient documentation

## 2020-12-16 DIAGNOSIS — Z7401 Bed confinement status: Secondary | ICD-10-CM | POA: Diagnosis not present

## 2020-12-16 LAB — CBC WITH DIFFERENTIAL/PLATELET
Abs Immature Granulocytes: 0.01 10*3/uL (ref 0.00–0.07)
Basophils Absolute: 0 10*3/uL (ref 0.0–0.1)
Basophils Relative: 0 %
Eosinophils Absolute: 0.1 10*3/uL (ref 0.0–0.5)
Eosinophils Relative: 1 %
HCT: 39.1 % (ref 36.0–46.0)
Hemoglobin: 13.3 g/dL (ref 12.0–15.0)
Immature Granulocytes: 0 %
Lymphocytes Relative: 11 %
Lymphs Abs: 0.8 10*3/uL (ref 0.7–4.0)
MCH: 31.9 pg (ref 26.0–34.0)
MCHC: 34 g/dL (ref 30.0–36.0)
MCV: 93.8 fL (ref 80.0–100.0)
Monocytes Absolute: 0.5 10*3/uL (ref 0.1–1.0)
Monocytes Relative: 7 %
Neutro Abs: 5.8 10*3/uL (ref 1.7–7.7)
Neutrophils Relative %: 81 %
Platelets: 154 10*3/uL (ref 150–400)
RBC: 4.17 MIL/uL (ref 3.87–5.11)
RDW: 14 % (ref 11.5–15.5)
WBC: 7.2 10*3/uL (ref 4.0–10.5)
nRBC: 0 % (ref 0.0–0.2)

## 2020-12-16 LAB — COMPREHENSIVE METABOLIC PANEL
ALT: 18 U/L (ref 0–44)
AST: 26 U/L (ref 15–41)
Albumin: 4.4 g/dL (ref 3.5–5.0)
Alkaline Phosphatase: 76 U/L (ref 38–126)
Anion gap: 7 (ref 5–15)
BUN: 26 mg/dL — ABNORMAL HIGH (ref 8–23)
CO2: 30 mmol/L (ref 22–32)
Calcium: 9.5 mg/dL (ref 8.9–10.3)
Chloride: 102 mmol/L (ref 98–111)
Creatinine, Ser: 0.9 mg/dL (ref 0.44–1.00)
GFR, Estimated: >60 mL/min (ref >60)
Glucose, Bld: 108 mg/dL — ABNORMAL HIGH (ref 70–99)
Potassium: 4.3 mmol/L (ref 3.5–5.1)
Sodium: 139 mmol/L (ref 135–145)
Total Bilirubin: 0.7 mg/dL (ref 0.3–1.2)
Total Protein: 7.5 g/dL (ref 6.5–8.1)

## 2020-12-16 LAB — RAPID URINE DRUG SCREEN, HOSP PERFORMED
Amphetamines: NOT DETECTED
Barbiturates: NOT DETECTED
Benzodiazepines: NOT DETECTED
Cocaine: NOT DETECTED
Opiates: NOT DETECTED
Tetrahydrocannabinol: NOT DETECTED

## 2020-12-16 LAB — URINALYSIS, ROUTINE W REFLEX MICROSCOPIC
Bacteria, UA: NONE SEEN
Bilirubin Urine: NEGATIVE
Glucose, UA: NEGATIVE mg/dL
Ketones, ur: NEGATIVE mg/dL
Nitrite: NEGATIVE
Protein, ur: NEGATIVE mg/dL
Specific Gravity, Urine: 1.015 (ref 1.005–1.030)
pH: 6 (ref 5.0–8.0)

## 2020-12-16 LAB — ETHANOL: Alcohol, Ethyl (B): <10 mg/dL (ref <10)

## 2020-12-16 NOTE — ED Triage Notes (Signed)
EMS reports from [facility], called out for AMS and hitting staff today. Staff states Pt throwing clothes out of room yesterday and aggression increasing today.  BP 120/90 HR 87 RR 18 Sp02 96 RA

## 2020-12-16 NOTE — ED Provider Notes (Signed)
[facility] Provider Note   CSN: [id number] Arrival date & time: 12/16/20  1306     History Chief Complaint  Patient presents with   Altered Mental Status    Miranda Johnson is a 87 y.o. female.  Patient with history of large stroke in the past comes from an assisted living facility chief complaint of altered behavior.  Staff has been saying that she has been throwing out her roommates clothes.  Her son thinks that she had thought that the roommate moved out when she was sick in the hospital for a few days but does not appear to understand that the roommate has moved back in now.  Patient herself is reportedly nonverbal from a large stroke in the past.  She has episodes of confusion at baseline per son, no diagnosis of Alzheimer's but history seems consistent with vascular dementia.  No reports of fevers or cough or vomiting or diarrhea.  History review of systems unable to obtain from the patient himself as she is nonverbal at baseline.      Past Medical History:  Diagnosis Date   Atrial fibrillation (HCC)    Hyperlipidemia    Hypertension    Shortness of breath    Stroke [facility])     Patient Active Problem List   Diagnosis Date Noted   Stroke [facility]) 12/16/2020   Shortness of breath     History reviewed. No pertinent surgical history.   OB History   No obstetric history on file.     History reviewed. No pertinent family history.  Social History   Tobacco Use   Smoking status: Never  Substance Use Topics   Alcohol use: No   Drug use: No    Home Medications Prior to Admission medications   Medication Sig Start Date End Date Taking? Authorizing Provider  calcium-vitamin D (OSCAL WITH D) 500-200 MG-UNIT per tablet Take 1 tablet by mouth 2 (two) times daily.    [provider]  Cholecalciferol (VITAMIN D) 2000 UNITS CAPS Take 1 capsule by mouth every morning.    [provider]  furosemide (LASIX) 40 MG tablet  Take 40 mg by mouth.    [provider]  metoprolol succinate (TOPROL-XL) 25 MG 24 hr tablet Take 25 mg by mouth daily.    [provider]  sertraline (ZOLOFT) 100 MG tablet Take 100 mg by mouth every morning.    [provider]  simvastatin (ZOCOR) 40 MG tablet Take 40 mg by mouth at bedtime.    [provider]  warfarin (COUMADIN) 3 MG tablet Take 3 mg by mouth daily.    [provider]    Allergies    Aspirin  Review of Systems   Review of Systems  Unable to perform ROS: Patient nonverbal   Physical Exam Updated Vital Signs BP 117/70   Pulse 63   Temp 98.1 F (36.7 C) (Oral)   Resp 16   SpO2 99%   Physical Exam Constitutional:      General: She is not in acute distress.    Appearance: Normal appearance. She is not ill-appearing.  HENT:     Head: Normocephalic.     Right Ear: External ear normal.     Left Ear: External ear normal.     Nose: No congestion.  Eyes:     Extraocular Movements: Extraocular movements intact.     Pupils: Pupils are equal, round, and reactive to light.  Cardiovascular:     Rate  and Rhythm: Normal rate.  Abdominal:     General: There is no distension.     Tenderness: There is no abdominal tenderness.  Musculoskeletal:        General: Normal range of motion.     Cervical back: Normal range of motion.  Neurological:     Mental Status: She is alert. Mental status is at baseline.    ED Results / Procedures / Treatments   Labs (all labs ordered are listed, but only abnormal results are displayed) Labs Reviewed  URINALYSIS, ROUTINE W REFLEX MICROSCOPIC - Abnormal; Notable for the following components:      Result Value   Hgb urine dipstick SMALL (*)    Leukocytes,Ua SMALL (*)    All other components within normal limits  COMPREHENSIVE METABOLIC PANEL - Abnormal; Notable for the following components:   Glucose, Bld 108 (*)    BUN 26 (*)    All other components within normal limits  CBC WITH  DIFFERENTIAL/PLATELET  ETHANOL  RAPID URINE DRUG SCREEN, HOSP PERFORMED    EKG None  Radiology CT Head Wo Contrast  Result Date: 12/16/2020 CLINICAL DATA:  Mental status change EXAM: CT HEAD WITHOUT CONTRAST TECHNIQUE: Contiguous axial images were obtained from the base of the skull through the vertex without intravenous contrast. COMPARISON:  CT head 03/16/2007 FINDINGS: Brain: Large territory chronic left MCA infarct, with progression into the left frontal lobe. Small chronic infarct right parietal lobe. Negative for acute infarct, hemorrhage, or mass lesion. Vascular: Negative for hyperdense vessel Skull: Negative Sinuses/Orbits: Negative Other: None IMPRESSION: No acute abnormality.  Large chronic left MCA infarct. Electronically Signed   By: Marlan Palau M.D.   On: 12/16/2020 17:48    Procedures Procedures   Medications Ordered in ED Medications - No data to display  ED Course  I have reviewed the triage vital signs and the nursing notes.  Pertinent labs & imaging results that were available during my care of the patient were reviewed by me and considered in my medical decision making (see chart for details).    MDM Rules/Calculators/A&P                          Labs are sent and are unremarkable white count normal chemistry normal.  Urinalysis shows no evidence of UTI.  CT imaging of the brain is unremarkable large evidence of prior stroke noted.  I spoke with the son regarding findings.  I feel the patient is medically clear to continue follow-up on an outpatient basis.  Will recommend return if she has fevers worsening symptoms or any additional concerns.  Final Clinical Impression(s) / ED Diagnoses Final diagnoses:  Confusion    Rx / DC Orders ED Discharge Orders     None        Cheryll Cockayne, MD 12/16/20 [phone]

## 2020-12-16 NOTE — Discharge Instructions (Addendum)
Call your primary care doctor or specialist as discussed in the next 2-3 days.   Return immediately back to the ER if:  Your symptoms worsen within the next 12-24 hours. You develop new symptoms such as new fevers, persistent vomiting, new pain, shortness of breath, or new weakness or numbness, or if you have any other concerns.  

## 2020-12-19 DIAGNOSIS — B351 Tinea unguium: Secondary | ICD-10-CM | POA: Diagnosis not present

## 2020-12-19 DIAGNOSIS — M19079 Primary osteoarthritis, unspecified ankle and foot: Secondary | ICD-10-CM | POA: Diagnosis not present

## 2020-12-19 DIAGNOSIS — L84 Corns and callosities: Secondary | ICD-10-CM | POA: Diagnosis not present

## 2020-12-19 DIAGNOSIS — M79673 Pain in unspecified foot: Secondary | ICD-10-CM | POA: Diagnosis not present

## 2020-12-20 DIAGNOSIS — F0151 Vascular dementia with behavioral disturbance: Secondary | ICD-10-CM | POA: Diagnosis not present

## 2021-01-25 ENCOUNTER — Other Ambulatory Visit: Payer: Self-pay

## 2021-01-25 ENCOUNTER — Emergency Department (HOSPITAL_COMMUNITY)
Admission: EM | Admit: 2021-01-25 | Discharge: 2021-01-25 | Disposition: A | Discharge status: Home / Self Care | Payer: Medicare Other | Attending: Emergency Medicine | Admitting: Emergency Medicine

## 2021-01-25 ENCOUNTER — Emergency Department (HOSPITAL_COMMUNITY): Payer: Medicare Other

## 2021-01-25 DIAGNOSIS — S40011A Contusion of right shoulder, initial encounter: Secondary | ICD-10-CM | POA: Diagnosis not present

## 2021-01-25 DIAGNOSIS — M4319 Spondylolisthesis, multiple sites in spine: Secondary | ICD-10-CM | POA: Diagnosis not present

## 2021-01-25 DIAGNOSIS — M25511 Pain in right shoulder: Secondary | ICD-10-CM | POA: Diagnosis not present

## 2021-01-25 DIAGNOSIS — W19XXXA Unspecified fall, initial encounter: Secondary | ICD-10-CM | POA: Diagnosis not present

## 2021-01-25 DIAGNOSIS — F0150 Vascular dementia without behavioral disturbance: Secondary | ICD-10-CM

## 2021-01-25 DIAGNOSIS — E041 Nontoxic single thyroid nodule: Secondary | ICD-10-CM | POA: Diagnosis not present

## 2021-01-25 DIAGNOSIS — R4182 Altered mental status, unspecified: Secondary | ICD-10-CM | POA: Diagnosis not present

## 2021-01-25 DIAGNOSIS — Z7901 Long term (current) use of anticoagulants: Secondary | ICD-10-CM | POA: Insufficient documentation

## 2021-01-25 DIAGNOSIS — M25519 Pain in unspecified shoulder: Secondary | ICD-10-CM | POA: Diagnosis not present

## 2021-01-25 DIAGNOSIS — Z9181 History of falling: Secondary | ICD-10-CM | POA: Diagnosis not present

## 2021-01-25 DIAGNOSIS — R0902 Hypoxemia: Secondary | ICD-10-CM | POA: Diagnosis not present

## 2021-01-25 DIAGNOSIS — I10 Essential (primary) hypertension: Secondary | ICD-10-CM | POA: Insufficient documentation

## 2021-01-25 DIAGNOSIS — Z79899 Other long term (current) drug therapy: Secondary | ICD-10-CM | POA: Insufficient documentation

## 2021-01-25 DIAGNOSIS — Z043 Encounter for examination and observation following other accident: Secondary | ICD-10-CM | POA: Diagnosis not present

## 2021-01-25 DIAGNOSIS — Z743 Need for continuous supervision: Secondary | ICD-10-CM | POA: Diagnosis not present

## 2021-01-25 DIAGNOSIS — R000 Tachycardia, unspecified: Secondary | ICD-10-CM | POA: Diagnosis not present

## 2021-01-25 DIAGNOSIS — I517 Cardiomegaly: Secondary | ICD-10-CM | POA: Diagnosis not present

## 2021-01-25 DIAGNOSIS — S4991XA Unspecified injury of right shoulder and upper arm, initial encounter: Secondary | ICD-10-CM | POA: Diagnosis present

## 2021-01-25 DIAGNOSIS — I639 Cerebral infarction, unspecified: Secondary | ICD-10-CM | POA: Diagnosis not present

## 2021-01-25 DIAGNOSIS — S199XXA Unspecified injury of neck, initial encounter: Secondary | ICD-10-CM | POA: Diagnosis not present

## 2021-01-25 LAB — CBC WITH DIFFERENTIAL/PLATELET
Abs Immature Granulocytes: 0.04 10*3/uL (ref 0.00–0.07)
Basophils Absolute: 0 10*3/uL (ref 0.0–0.1)
Basophils Relative: 0 %
Eosinophils Absolute: 0 10*3/uL (ref 0.0–0.5)
Eosinophils Relative: 0 %
HCT: 39 % (ref 36.0–46.0)
Hemoglobin: 13 g/dL (ref 12.0–15.0)
Immature Granulocytes: 0 %
Lymphocytes Relative: 4 %
Lymphs Abs: 0.4 10*3/uL — ABNORMAL LOW (ref 0.7–4.0)
MCH: 31.5 pg (ref 26.0–34.0)
MCHC: 33.3 g/dL (ref 30.0–36.0)
MCV: 94.4 fL (ref 80.0–100.0)
Monocytes Absolute: 0.4 10*3/uL (ref 0.1–1.0)
Monocytes Relative: 4 %
Neutro Abs: 9.1 10*3/uL — ABNORMAL HIGH (ref 1.7–7.7)
Neutrophils Relative %: 92 %
Platelets: 143 10*3/uL — ABNORMAL LOW (ref 150–400)
RBC: 4.13 MIL/uL (ref 3.87–5.11)
RDW: 14.3 % (ref 11.5–15.5)
WBC: 10 10*3/uL (ref 4.0–10.5)
nRBC: 0 % (ref 0.0–0.2)

## 2021-01-25 LAB — BASIC METABOLIC PANEL
Anion gap: 6 (ref 5–15)
BUN: 24 mg/dL — ABNORMAL HIGH (ref 8–23)
CO2: 30 mmol/L (ref 22–32)
Calcium: 9.3 mg/dL (ref 8.9–10.3)
Chloride: 102 mmol/L (ref 98–111)
Creatinine, Ser: 1.04 mg/dL — ABNORMAL HIGH (ref 0.44–1.00)
GFR, Estimated: 51 mL/min — ABNORMAL LOW (ref >60)
Glucose, Bld: 163 mg/dL — ABNORMAL HIGH (ref 70–99)
Potassium: 4.3 mmol/L (ref 3.5–5.1)
Sodium: 138 mmol/L (ref 135–145)

## 2021-01-25 LAB — CBG MONITORING, ED: Glucose-Capillary: 177 mg/dL — ABNORMAL HIGH (ref 70–99)

## 2021-01-25 MED ORDER — ACETAMINOPHEN 325 MG PO TABS: 650.0000 mg | ORAL_TABLET | Freq: Once | ORAL | Status: DC

## 2021-01-25 NOTE — ED Notes (Signed)
Patient transported to X-ray 

## 2021-01-25 NOTE — Discharge Instructions (Addendum)
Return for new concerns. Tylenol as needed for any pain every 4 hours. Ice as needed for 10 minutes if tolerated. Return for new concerns. Wear shoulder sling only if needed. Have INR checked per routine/primary doctor.

## 2021-01-25 NOTE — ED Notes (Signed)
Report called to [facility]. 

## 2021-01-25 NOTE — ED Notes (Signed)
Report given to Candace N, RN  

## 2021-01-25 NOTE — ED Notes (Signed)
Patient verbalizes understanding of discharge instructions. Opportunity for questioning and answers were provided. Armband removed by staff, pt discharged from ED via [facility]. Prior nurse called and gave report to the facility. Pt leaving in stable condition.

## 2021-01-25 NOTE — ED Provider Notes (Signed)
[facility] EMERGENCY DEPARTMENT Provider Note   CSN: [id number] Arrival date & time: 01/25/21  1210     History Chief Complaint  Patient presents with   Fall    Pt arrived via [facility] with c/c of fall. Per EMS [facility] is from [facility]. Hx of dementia. Pt was found on floor, pt is on eliquis.Pt normally is able to walk at baseline without any assistance.Pt arrived in c-collar but denies any pain to neck pr back. Pt has deformity to right shoulder but self reduced. Pt speaks limited english.   183/70, 80HR NRS, BS 126, 98%RA fent     [facility] is a 87 y.o. female.  Patient presents with EMS from [facility] facility with history of significant dementia and found on the floor.  Unwitnessed.  Patient is able to ambulate at baseline and at baseline neurologically/dementia wise.  No fevers or infectious concerns.  Unable to get details from patient.      Past Medical History:  Diagnosis Date   Atrial fibrillation (HCC)    Hyperlipidemia    Hypertension    Shortness of breath    Stroke [facility])     Patient Active Problem List   Diagnosis Date Noted   Stroke (HCC) 12/16/2020   Shortness of breath     No past surgical history on file.   OB History   No obstetric history on file.     No family history on file.  Social History   Tobacco Use   Smoking status: Never  Substance Use Topics   Alcohol use: No   Drug use: No    Home Medications Prior to Admission medications   Medication Sig Start Date End Date Taking? Authorizing Provider  calcium-vitamin D (OSCAL WITH D) 500-200 MG-UNIT per tablet Take 1 tablet by mouth 2 (two) times daily.    [provider]  Cholecalciferol (VITAMIN D) 2000 UNITS CAPS Take 1 capsule by mouth every morning.    [provider]  furosemide (LASIX) 40 MG tablet Take 40 mg by mouth.    [provider]  metoprolol succinate (TOPROL-XL) 25 MG 24 hr tablet Take 25 mg by mouth daily.     [provider]  sertraline (ZOLOFT) 100 MG tablet Take 100 mg by mouth every morning.    [provider]  simvastatin (ZOCOR) 40 MG tablet Take 40 mg by mouth at bedtime.    [provider]  warfarin (COUMADIN) 3 MG tablet Take 3 mg by mouth daily.    [provider]    Allergies    Aspirin  Review of Systems   Review of Systems  Unable to perform ROS: Dementia   Physical Exam Updated Vital Signs BP 97/80 (BP Location: Left Arm)   Pulse 87   Temp 97.9 F (36.6 C)   Resp 16   Wt 95.6 kg   SpO2 100%   Physical Exam Vitals and nursing note reviewed.  Constitutional:      General: She is not in acute distress.    Appearance: She is well-developed.  HENT:     Head: Normocephalic and atraumatic.     Mouth/Throat:     Mouth: Mucous membranes are moist.  Eyes:     General:        Right eye: No discharge.        Left eye: No discharge.     Conjunctiva/sclera: Conjunctivae normal.  Neck:     Trachea: No tracheal deviation.  Cardiovascular:     Rate and Rhythm: Normal rate.     Heart sounds: No murmur heard. Pulmonary:     Effort: Pulmonary effort is normal.  Abdominal:     General: There is no distension.     Palpations: Abdomen is soft.     Tenderness: There is no abdominal tenderness. There is no guarding.  Musculoskeletal:        General: Swelling and tenderness present.     Cervical back: Normal range of motion and neck supple. No rigidity.     Comments: Patient has no tenderness with range of motion of hips knees ankles or lower extremities to palpation.  Equal strength lower extremities bilateral.  Patient has equal strength upper extremities however mild tenderness and swelling to right shoulder with flexion.  Full range of motion head neck without discomfort.  No spinal tenderness on exam.  Skin:    General: Skin is warm.     Capillary Refill: Capillary refill takes less than 2 seconds.     Findings: No rash.  Neurological:      General: No focal deficit present.     Mental Status: She is alert.     Cranial Nerves: No cranial nerve deficit.     Motor: No weakness.  Psychiatric:     Comments: Clinical dementia    ED Results / Procedures / Treatments   Labs (all labs ordered are listed, but only abnormal results are displayed) Labs Reviewed  CBC WITH DIFFERENTIAL/PLATELET - Abnormal; Notable for the following components:      Result Value   Platelets 143 (*)    Neutro Abs 9.1 (*)    Lymphs Abs 0.4 (*)    All other components within normal limits  BASIC METABOLIC PANEL - Abnormal; Notable for the following components:   Glucose, Bld 163 (*)    BUN 24 (*)    Creatinine, Ser 1.04 (*)    GFR, Estimated 51 (*)    All other components within normal limits  CBG MONITORING, ED - Abnormal; Notable for the following components:   Glucose-Capillary 177 (*)    All other components within normal limits    EKG None  Radiology DG Chest 1 View  Result Date: 01/25/2021 CLINICAL DATA:  Unwitnessed fall. EXAM: CHEST  1 VIEW COMPARISON:  February 16, 2013. FINDINGS: Stable cardiomegaly. Both lungs are clear. The visualized skeletal structures are unremarkable. IMPRESSION: No active disease. Electronically Signed   By: Lupita Raider M.D.   On: 01/25/2021 14:00   DG Pelvis 1-2 Views  Result Date: 01/25/2021 CLINICAL DATA:  Unwitnessed fall. EXAM: PELVIS - 1-2 VIEW COMPARISON:  None. FINDINGS: There is no evidence of pelvic fracture or diastasis. No pelvic bone lesions are seen. IMPRESSION: Negative. Electronically Signed   By: Lupita Raider M.D.   On: 01/25/2021 14:01   CT Head Wo Contrast  Result Date: 01/25/2021 CLINICAL DATA:  Fall.  Injury EXAM: CT HEAD WITHOUT CONTRAST CT CERVICAL SPINE WITHOUT CONTRAST TECHNIQUE: Multidetector CT imaging of the head and cervical spine was performed following the standard protocol without intravenous contrast. Multiplanar CT image reconstructions of the cervical spine were  also generated. COMPARISON:  CT head 03/16/2007 FINDINGS: CT HEAD FINDINGS Brain: Large territory chronic infarct left MCA territory unchanged from the prior study. Smaller chronic infarct in the right occipital parietal lobe also unchanged. Progressive atrophy. No hydrocephalus Negative for acute infarct, hemorrhage, mass Vascular: Calcification left MCA likely atherosclerotic. Negative for hyperdense MCA. Skull: Negative Sinuses/Orbits:  Negative Other: None CT CERVICAL SPINE FINDINGS Alignment: Slight anterolisthesis C3-4.  Mild anterolisthesis C7-T1 Skull base and vertebrae: Negative for fracture Soft tissues and spinal canal: Cystic nodule left thyroid measuring 18 mm in diameter. Small amount of thyroid tissue on the right. Atherosclerotic calcification carotid bifurcation bilaterally Disc levels: Multilevel disc and facet degeneration throughout the cervical spine with spurring. Bilateral foraminal encroachment due to spurring. Upper chest: Pleuroparenchymal scarring in the apices bilaterally. Other: None IMPRESSION: No acute intracranial abnormality Negative for cervical fracture. Cystic nodule left thyroid. Thyroid ultrasound recommended. (Ref: J Am Coll Radiol. 2015 Feb;12(2): 143-50). Electronically Signed   By: Marlan Palau M.D.   On: 01/25/2021 15:03   CT Cervical Spine Wo Contrast  Result Date: 01/25/2021 CLINICAL DATA:  Fall.  Injury EXAM: CT HEAD WITHOUT CONTRAST CT CERVICAL SPINE WITHOUT CONTRAST TECHNIQUE: Multidetector CT imaging of the head and cervical spine was performed following the standard protocol without intravenous contrast. Multiplanar CT image reconstructions of the cervical spine were also generated. COMPARISON:  CT head 03/16/2007 FINDINGS: CT HEAD FINDINGS Brain: Large territory chronic infarct left MCA territory unchanged from the prior study. Smaller chronic infarct in the right occipital parietal lobe also unchanged. Progressive atrophy. No hydrocephalus Negative for acute  infarct, hemorrhage, mass Vascular: Calcification left MCA likely atherosclerotic. Negative for hyperdense MCA. Skull: Negative Sinuses/Orbits: Negative Other: None CT CERVICAL SPINE FINDINGS Alignment: Slight anterolisthesis C3-4.  Mild anterolisthesis C7-T1 Skull base and vertebrae: Negative for fracture Soft tissues and spinal canal: Cystic nodule left thyroid measuring 18 mm in diameter. Small amount of thyroid tissue on the right. Atherosclerotic calcification carotid bifurcation bilaterally Disc levels: Multilevel disc and facet degeneration throughout the cervical spine with spurring. Bilateral foraminal encroachment due to spurring. Upper chest: Pleuroparenchymal scarring in the apices bilaterally. Other: None IMPRESSION: No acute intracranial abnormality Negative for cervical fracture. Cystic nodule left thyroid. Thyroid ultrasound recommended. (Ref: J Am Coll Radiol. 2015 Feb;12(2): 143-50). Electronically Signed   By: Marlan Palau M.D.   On: 01/25/2021 15:03    Procedures Procedures   Medications Ordered in ED Medications  acetaminophen (TYLENOL) tablet 650 mg (has no administration in time range)    ED Course  I have reviewed the triage vital signs and the nursing notes.  Pertinent labs & imaging results that were available during my care of the patient were reviewed by me and considered in my medical decision making (see chart for details).    MDM Rules/Calculators/A&P                           Patient presents from nursing facility/memory care after unwitnessed fall.  Patient at baseline clinically and per report and history.  Aside from mild agitation intermittent patient doing well in the ER.  Tylenol ordered for pain.  CT head neck reviewed no acute fracture or bleeding.  Chest x-ray and pelvis x-ray no acute fracture.  Reviewed x-rays and chest x-ray did not show the right shoulder and she does have swelling and pain.  Shoulder sling ordered an x-ray ordered prior to son  picking up and returning to memory care facility.   Final Clinical Impression(s) / ED Diagnoses Final diagnoses:  Vascular dementia without behavioral disturbance (HCC)  Fall, initial encounter  Contusion of right shoulder, initial encounter    Rx / DC Orders ED Discharge Orders     None        Blane Ohara, MD 01/25/21 1542

## 2021-01-25 NOTE — ED Notes (Signed)
Interpreter used [id number]

## 2021-01-25 NOTE — ED Triage Notes (Signed)
Pt arrived via [facility] with c/c of fall. Per EMS pt is from [place]. Hx of dementia. Pt was found on floor, pt is on eliquis.Pt normally is able to walk at baseline without any assistance.Pt arrived in c-collar but denies any pain to neck pr back. Pt has deformity to right shoulder but self reduced. Pt speaks limited english.   183/70, 80HR NRS, BS 126, 98%RA fent

## 2021-02-14 DIAGNOSIS — S4991XA Unspecified injury of right shoulder and upper arm, initial encounter: Secondary | ICD-10-CM | POA: Diagnosis not present

## 2021-03-03 DIAGNOSIS — Z9181 History of falling: Secondary | ICD-10-CM | POA: Diagnosis not present

## 2021-03-03 DIAGNOSIS — F32A Depression, unspecified: Secondary | ICD-10-CM | POA: Diagnosis not present

## 2021-03-03 DIAGNOSIS — E7800 Pure hypercholesterolemia, unspecified: Secondary | ICD-10-CM | POA: Diagnosis not present

## 2021-03-03 DIAGNOSIS — I10 Essential (primary) hypertension: Secondary | ICD-10-CM | POA: Diagnosis not present

## 2021-03-03 DIAGNOSIS — M8580 Other specified disorders of bone density and structure, unspecified site: Secondary | ICD-10-CM | POA: Diagnosis not present

## 2021-03-03 DIAGNOSIS — Z7901 Long term (current) use of anticoagulants: Secondary | ICD-10-CM | POA: Diagnosis not present

## 2021-03-03 DIAGNOSIS — I69351 Hemiplegia and hemiparesis following cerebral infarction affecting right dominant side: Secondary | ICD-10-CM | POA: Diagnosis not present

## 2021-03-03 DIAGNOSIS — I4891 Unspecified atrial fibrillation: Secondary | ICD-10-CM | POA: Diagnosis not present

## 2021-03-03 DIAGNOSIS — I69320 Aphasia following cerebral infarction: Secondary | ICD-10-CM | POA: Diagnosis not present

## 2021-04-02 DIAGNOSIS — I4891 Unspecified atrial fibrillation: Secondary | ICD-10-CM | POA: Diagnosis not present

## 2021-04-02 DIAGNOSIS — M8580 Other specified disorders of bone density and structure, unspecified site: Secondary | ICD-10-CM | POA: Diagnosis not present

## 2021-04-02 DIAGNOSIS — F32A Depression, unspecified: Secondary | ICD-10-CM | POA: Diagnosis not present

## 2021-04-02 DIAGNOSIS — I69320 Aphasia following cerebral infarction: Secondary | ICD-10-CM | POA: Diagnosis not present

## 2021-04-02 DIAGNOSIS — Z7901 Long term (current) use of anticoagulants: Secondary | ICD-10-CM | POA: Diagnosis not present

## 2021-04-02 DIAGNOSIS — Z9181 History of falling: Secondary | ICD-10-CM | POA: Diagnosis not present

## 2021-04-02 DIAGNOSIS — I10 Essential (primary) hypertension: Secondary | ICD-10-CM | POA: Diagnosis not present

## 2021-04-02 DIAGNOSIS — I69351 Hemiplegia and hemiparesis following cerebral infarction affecting right dominant side: Secondary | ICD-10-CM | POA: Diagnosis not present

## 2021-04-02 DIAGNOSIS — E7800 Pure hypercholesterolemia, unspecified: Secondary | ICD-10-CM | POA: Diagnosis not present

## 2021-05-02 DIAGNOSIS — B351 Tinea unguium: Secondary | ICD-10-CM | POA: Diagnosis not present

## 2021-05-02 DIAGNOSIS — L851 Acquired keratosis [keratoderma] palmaris et plantaris: Secondary | ICD-10-CM | POA: Diagnosis not present

## 2021-05-02 DIAGNOSIS — M79674 Pain in right toe(s): Secondary | ICD-10-CM | POA: Diagnosis not present

## 2021-05-02 DIAGNOSIS — I483 Typical atrial flutter: Secondary | ICD-10-CM | POA: Diagnosis not present

## 2021-11-01 DIAGNOSIS — Z79899 Other long term (current) drug therapy: Secondary | ICD-10-CM | POA: Diagnosis not present

## 2021-11-01 DIAGNOSIS — Z0000 Encounter for general adult medical examination without abnormal findings: Secondary | ICD-10-CM | POA: Diagnosis not present

## 2021-11-01 DIAGNOSIS — E7800 Pure hypercholesterolemia, unspecified: Secondary | ICD-10-CM | POA: Diagnosis not present

## 2021-11-01 DIAGNOSIS — I4891 Unspecified atrial fibrillation: Secondary | ICD-10-CM | POA: Diagnosis not present

## 2021-11-02 DIAGNOSIS — B351 Tinea unguium: Secondary | ICD-10-CM | POA: Diagnosis not present

## 2021-11-02 DIAGNOSIS — M19071 Primary osteoarthritis, right ankle and foot: Secondary | ICD-10-CM | POA: Diagnosis not present

## 2021-11-02 DIAGNOSIS — M79675 Pain in left toe(s): Secondary | ICD-10-CM | POA: Diagnosis not present

## 2021-11-02 DIAGNOSIS — M79674 Pain in right toe(s): Secondary | ICD-10-CM | POA: Diagnosis not present

## 2022-02-01 DIAGNOSIS — E039 Hypothyroidism, unspecified: Secondary | ICD-10-CM | POA: Diagnosis not present

## 2022-03-26 ENCOUNTER — Encounter: Payer: Self-pay | Admitting: Podiatry

## 2022-03-26 ENCOUNTER — Ambulatory Visit: Payer: Medicare Other | Admitting: Podiatry

## 2022-03-26 VITALS — BP 136/63 | HR 78 | Temp 97.9°F

## 2022-03-26 DIAGNOSIS — B351 Tinea unguium: Secondary | ICD-10-CM

## 2022-03-26 DIAGNOSIS — M79675 Pain in left toe(s): Secondary | ICD-10-CM

## 2022-03-26 DIAGNOSIS — M79674 Pain in right toe(s): Secondary | ICD-10-CM | POA: Diagnosis not present

## 2022-03-26 DIAGNOSIS — L600 Ingrowing nail: Secondary | ICD-10-CM | POA: Diagnosis not present

## 2022-03-30 NOTE — Progress Notes (Signed)
Subjective: Miranda Johnson presents today referred by Miranda Bussing, MD for complaint of painful elongated mycotic toenails 1-5 bilaterally which are tender when wearing enclosed shoe gear. Pain is relieved with periodic professional debridement.  Chief Complaint  Patient presents with   Nail Problem    Routine foot care PCP-[place] PCP VST-02/01/2022     She is a resident of [facility] She is accompanied by her son, Miranda [facility] on today's visit. Patient had been seeing facility Podiatrist, but chooses to bring her to an office for foot care.  Past Medical History:  Diagnosis Date   Atrial fibrillation (HCC)    Hyperlipidemia    Hypertension    Shortness of breath    Stroke [facility])      Patient Active Problem List   Diagnosis Date Noted   Stroke [facility]) 12/16/2020   Shortness of breath      History reviewed. No pertinent surgical history.   Current Outpatient Medications on File Prior to Visit  Medication Sig Dispense Refill   calcium-vitamin D (OSCAL WITH D) 500-200 MG-UNIT per tablet Take 1 tablet by mouth 2 (two) times daily.     Cholecalciferol (VITAMIN D) 2000 UNITS CAPS Take 1 capsule by mouth every morning.     ELIQUIS 2.5 MG TABS tablet Take 2.5 mg by mouth 2 (two) times daily.     furosemide (LASIX) 40 MG tablet Take 40 mg by mouth.     levothyroxine (SYNTHROID) 50 MCG tablet Take 50 mcg by mouth daily.     metoprolol succinate (TOPROL-XL) 25 MG 24 hr tablet Take 25 mg by mouth daily.     sertraline (ZOLOFT) 100 MG tablet Take 100 mg by mouth every morning.     simvastatin (ZOCOR) 40 MG tablet Take 40 mg by mouth at bedtime.     warfarin (COUMADIN) 3 MG tablet Take 3 mg by mouth daily.     No current facility-administered medications on file prior to visit.     Allergies  Allergen Reactions   Aspirin Other (See Comments)    unknown     Social History   Occupational History   Not on file  Tobacco Use   Smoking status: Never    Smokeless tobacco: Not on file  Substance and Sexual Activity   Alcohol use: No   Drug use: No   Sexual activity: Not on file     History reviewed. No pertinent family history.    There is no immunization history on file for this patient.   Objective: Vitals:   03/26/22 1609  BP: 136/63  Pulse: 78  Temp: 97.9 F (36.6 C)    Miranda Johnson is a pleasant 88 y.o. female thin build in NAD. AAO x 3.  Vascular Examination: Vascular status intact b/l with palpable pedal pulses. Pedal hair present b/l. CFT immediate b/l. No edema. No pain with calf compression b/l. Skin temperature gradient WNL b/l. No cyanosis or clubbing noted b/l LE.  Neurological Examination: Sensation grossly intact b/l with 10 gram monofilament. Vibratory sensation intact b/l.   Dermatological Examination: Pedal skin with normal turgor, texture and tone b/l. Toenails 1-5 b/l thick, discolored, elongated with subungual debris and pain on dorsal palpation. No hyperkeratotic lesions noted b/l. Incurvated nailplate medial border right hallux.  Nail border hypertrophy absent. There is tenderness to palpation. Sign(s) of infection: no clinical signs of infection noted on examination today..  Musculoskeletal Examination: Muscle strength 5/5 to b/l LE. HAV with bunion bilaterally and hammertoes  2-5 b/l.  Radiographs: None  Assessment: 1. Pain due to onychomycosis of toenails of both feet   2. Ingrown toenail without infection     Plan: -Patient's family member present. All questions/concerns addressed on today's visit. -Examined patient. -Toenails 1-5 b/l were debrided in length and girth with sterile nail nippers and dremel without iatrogenic bleeding.  -Offending nail border debrided and curretaged right great toe utilizing sterile nail nipper and currette. Border cleansed with alcohol and triple antibiotic applied. No further treatment required by patient/caregiver. Call office if there are any  concerns. -Patient/POA to call should there be question/concern in the interim.  Return in about 3 months (around 06/25/2022).  Marzetta Board, DPM

## 2022-04-09 DIAGNOSIS — E039 Hypothyroidism, unspecified: Secondary | ICD-10-CM | POA: Diagnosis not present

## 2022-07-12 ENCOUNTER — Ambulatory Visit: Payer: Medicare Other | Admitting: Podiatry

## 2022-07-16 ENCOUNTER — Ambulatory Visit: Payer: Medicare Other | Admitting: Podiatry

## 2022-08-20 ENCOUNTER — Ambulatory Visit (INDEPENDENT_AMBULATORY_CARE_PROVIDER_SITE_OTHER): Payer: Medicare Other | Admitting: Podiatry

## 2022-08-20 ENCOUNTER — Encounter: Payer: Self-pay | Admitting: Podiatry

## 2022-08-20 VITALS — BP 146/75 | HR 83

## 2022-08-20 DIAGNOSIS — B351 Tinea unguium: Secondary | ICD-10-CM | POA: Diagnosis not present

## 2022-08-20 DIAGNOSIS — M79674 Pain in right toe(s): Secondary | ICD-10-CM | POA: Diagnosis not present

## 2022-08-20 DIAGNOSIS — M79675 Pain in left toe(s): Secondary | ICD-10-CM | POA: Diagnosis not present

## 2022-08-20 NOTE — Progress Notes (Signed)
  Subjective:  Patient ID: Miranda Johnson, female    DOB: 1928-09-28,  MRN: [id number]  Miranda Johnson presents to clinic today for painful thick toenails that are difficult to trim. Pain interferes with ambulation. Aggravating factors include wearing enclosed shoe gear. Pain is relieved with periodic professional debridement. She is a resident at [facility]. She is accompanied by her son on today's visit. Chief Complaint  Patient presents with   Nail Problem    Toenail trim, not a diabetic   New problem(s): None.   PCP is Koirala, Dibas, MD.  Allergies  Allergen Reactions   Aspirin Other (See Comments)    unknown   Review of Systems: Negative except as noted in the HPI.  Objective: No changes noted in today's physical examination. Vitals:   08/20/22 0850  BP: (!) 146/75  Pulse: 83   Special D Regal is a pleasant 89 y.o. female WD, WN in NAD. AAO x 3.  Vascular Examination: Vascular status intact b/l with palpable pedal pulses. Pedal hair present b/l. CFT immediate b/l. No edema. No pain with calf compression b/l. Skin temperature gradient WNL b/l. No cyanosis or clubbing noted b/l LE.  Neurological Examination: Sensation grossly intact b/l with 10 gram monofilament. Vibratory sensation intact b/l.   Dermatological Examination: Pedal skin with normal turgor, texture and tone b/l. Toenails 1-5 b/l thick, discolored, elongated with subungual debris and pain on dorsal palpation. No hyperkeratotic lesions noted b/l.   Incurvated nailplate medial border right hallux.  Nail border hypertrophy absent. There is tenderness to palpation. Sign(s) of infection: no clinical signs of infection noted on examination today.  Musculoskeletal Examination: Muscle strength 5/5 to b/l LE. HAV with bunion bilaterally and hammertoes 2-5 b/l.  Radiographs: None  Assessment/Plan: 1. Pain due to onychomycosis of toenails of both feet   -Patient was evaluated and treated.  All patient's and/or POA's questions/concerns answered on today's visit. -Facility to continue fall precautions and pressure precautions. -Patient to continue soft, supportive shoe gear daily. -Mycotic toenails 1-5 bilaterally were debrided in length and girth with sterile nail nippers and dremel without incident. -Patient/POA to call should there be question/concern in the interim.   Return in about 3 months (around 11/18/2022).  Marzetta Board, DPM

## 2022-11-06 DIAGNOSIS — F321 Major depressive disorder, single episode, moderate: Secondary | ICD-10-CM | POA: Diagnosis not present

## 2022-11-06 DIAGNOSIS — F01B3 Vascular dementia, moderate, with mood disturbance: Secondary | ICD-10-CM | POA: Diagnosis not present

## 2022-11-06 DIAGNOSIS — E43 Unspecified severe protein-calorie malnutrition: Secondary | ICD-10-CM | POA: Diagnosis not present

## 2022-11-06 DIAGNOSIS — Z0001 Encounter for general adult medical examination with abnormal findings: Secondary | ICD-10-CM | POA: Diagnosis not present

## 2022-11-06 DIAGNOSIS — E7800 Pure hypercholesterolemia, unspecified: Secondary | ICD-10-CM | POA: Diagnosis not present

## 2022-11-06 DIAGNOSIS — E039 Hypothyroidism, unspecified: Secondary | ICD-10-CM | POA: Diagnosis not present

## 2022-11-06 DIAGNOSIS — D6869 Other thrombophilia: Secondary | ICD-10-CM | POA: Diagnosis not present

## 2022-11-06 DIAGNOSIS — F03B0 Unspecified dementia, moderate, without behavioral disturbance, psychotic disturbance, mood disturbance, and anxiety: Secondary | ICD-10-CM | POA: Diagnosis not present

## 2022-11-06 DIAGNOSIS — I482 Chronic atrial fibrillation, unspecified: Secondary | ICD-10-CM | POA: Diagnosis not present

## 2022-11-06 DIAGNOSIS — Z79899 Other long term (current) drug therapy: Secondary | ICD-10-CM | POA: Diagnosis not present

## 2022-11-20 ENCOUNTER — Ambulatory Visit (INDEPENDENT_AMBULATORY_CARE_PROVIDER_SITE_OTHER): Payer: Medicare Other | Admitting: Podiatry

## 2022-11-20 ENCOUNTER — Encounter: Payer: Self-pay | Admitting: Podiatry

## 2022-11-20 DIAGNOSIS — M79675 Pain in left toe(s): Secondary | ICD-10-CM

## 2022-11-20 DIAGNOSIS — B351 Tinea unguium: Secondary | ICD-10-CM | POA: Diagnosis not present

## 2022-11-20 DIAGNOSIS — M79674 Pain in right toe(s): Secondary | ICD-10-CM | POA: Diagnosis not present

## 2022-11-20 NOTE — Progress Notes (Signed)
  Subjective:  Patient ID: Miranda Johnson, female    DOB: [birth year],  MRN: [id number]  Miranda Johnson presents to clinic today for painful thick toenails that are difficult to trim. Pain interferes with ambulation. Aggravating factors include wearing enclosed shoe gear. Pain is relieved with periodic professional debridement. She is accompanied by her son on today's visit.  Chief Complaint  Patient presents with   Nail Problem    [facility] PCP-Koirala PCP VST- 11/06/2022   New problem(s): None.   PCP is Koirala, Dibas, MD.  Allergies  Allergen Reactions   Aspirin Other (See Comments)    unknown    Review of Systems: Negative except as noted in the HPI.  Objective: No changes noted in today's physical examination. There were no vitals filed for this visit. Miranda Johnson is a pleasant 89 y.o. female WD, WN in NAD. AAO x 3.  Vascular Examination: Vascular status intact b/l with palpable pedal pulses. Pedal hair present b/l. CFT immediate b/l. No edema. No pain with calf compression b/l. Skin temperature gradient WNL b/l. No cyanosis or clubbing noted b/l LE.  Neurological Examination: Sensation grossly intact b/l with 10 gram monofilament. Vibratory sensation intact b/l.   Dermatological Examination: Pedal skin with normal turgor, texture and tone b/l. Toenails 1-5 b/l thick, discolored, elongated with subungual debris and pain on dorsal palpation. No hyperkeratotic lesions noted b/l.   Incurvated nailplate medial border right hallux.  Nail border hypertrophy absent. There is tenderness to palpation. Sign(s) of infection: no clinical signs of infection noted on examination today.  Musculoskeletal Examination: Muscle strength 5/5 to b/l LE. HAV with bunion bilaterally and hammertoes 2-5 b/l.  Radiographs: None  Assessment/Plan: 1. Pain due to onychomycosis of toenails of both feet     Patient was evaluated and treated. All patient's and/or POA's questions/concerns  addressed on today's visit. Toenails 1-5 debrided in length and girth without incident. Continue soft, supportive shoe gear daily. Report any pedal injuries to medical professional. Call office if there are any questions/concerns. -Facility to continue fall precautions and pressure precautions. -Patient/POA to call should there be question/concern in the interim.   Return in about 3 months (around 02/20/2023).  Miranda Johnson, DPM

## 2023-04-09 DIAGNOSIS — D6869 Other thrombophilia: Secondary | ICD-10-CM | POA: Diagnosis not present

## 2023-04-09 DIAGNOSIS — I482 Chronic atrial fibrillation, unspecified: Secondary | ICD-10-CM | POA: Diagnosis not present

## 2023-04-09 DIAGNOSIS — R21 Rash and other nonspecific skin eruption: Secondary | ICD-10-CM | POA: Diagnosis not present

## 2023-04-09 DIAGNOSIS — F01B18 Vascular dementia, moderate, with other behavioral disturbance: Secondary | ICD-10-CM | POA: Diagnosis not present

## 2023-04-17 ENCOUNTER — Emergency Department (HOSPITAL_COMMUNITY)

## 2023-04-17 ENCOUNTER — Emergency Department (HOSPITAL_COMMUNITY)
Admission: EM | Admit: 2023-04-17 | Discharge: 2023-04-18 | Disposition: A | Discharge status: Home / Self Care | Attending: Emergency Medicine | Admitting: Emergency Medicine

## 2023-04-17 DIAGNOSIS — S032XXA Dislocation of tooth, initial encounter: Secondary | ICD-10-CM | POA: Insufficient documentation

## 2023-04-17 DIAGNOSIS — S80212A Abrasion, left knee, initial encounter: Secondary | ICD-10-CM | POA: Insufficient documentation

## 2023-04-17 DIAGNOSIS — W050XXA Fall from non-moving wheelchair, initial encounter: Secondary | ICD-10-CM | POA: Diagnosis not present

## 2023-04-17 DIAGNOSIS — S0993XA Unspecified injury of face, initial encounter: Secondary | ICD-10-CM | POA: Diagnosis not present

## 2023-04-17 DIAGNOSIS — S0191XA Laceration without foreign body of unspecified part of head, initial encounter: Secondary | ICD-10-CM

## 2023-04-17 DIAGNOSIS — I517 Cardiomegaly: Secondary | ICD-10-CM | POA: Insufficient documentation

## 2023-04-17 DIAGNOSIS — Y92001 Dining room of unspecified non-institutional (private) residence as the place of occurrence of the external cause: Secondary | ICD-10-CM | POA: Diagnosis not present

## 2023-04-17 DIAGNOSIS — W19XXXA Unspecified fall, initial encounter: Secondary | ICD-10-CM

## 2023-04-17 DIAGNOSIS — I10 Essential (primary) hypertension: Secondary | ICD-10-CM | POA: Diagnosis not present

## 2023-04-17 DIAGNOSIS — Z79899 Other long term (current) drug therapy: Secondary | ICD-10-CM | POA: Insufficient documentation

## 2023-04-17 DIAGNOSIS — S299XXA Unspecified injury of thorax, initial encounter: Secondary | ICD-10-CM | POA: Diagnosis not present

## 2023-04-17 DIAGNOSIS — S0990XA Unspecified injury of head, initial encounter: Secondary | ICD-10-CM

## 2023-04-17 DIAGNOSIS — Z7901 Long term (current) use of anticoagulants: Secondary | ICD-10-CM | POA: Insufficient documentation

## 2023-04-17 DIAGNOSIS — I69320 Aphasia following cerebral infarction: Secondary | ICD-10-CM | POA: Insufficient documentation

## 2023-04-17 DIAGNOSIS — S0101XA Laceration without foreign body of scalp, initial encounter: Secondary | ICD-10-CM | POA: Insufficient documentation

## 2023-04-17 DIAGNOSIS — S199XXA Unspecified injury of neck, initial encounter: Secondary | ICD-10-CM | POA: Diagnosis not present

## 2023-04-17 DIAGNOSIS — G9389 Other specified disorders of brain: Secondary | ICD-10-CM | POA: Diagnosis not present

## 2023-04-17 DIAGNOSIS — M5032 Other cervical disc degeneration, mid-cervical region, unspecified level: Secondary | ICD-10-CM | POA: Diagnosis not present

## 2023-04-17 DIAGNOSIS — Z23 Encounter for immunization: Secondary | ICD-10-CM | POA: Diagnosis not present

## 2023-04-17 DIAGNOSIS — M8580 Other specified disorders of bone density and structure, unspecified site: Secondary | ICD-10-CM | POA: Diagnosis not present

## 2023-04-17 DIAGNOSIS — M4802 Spinal stenosis, cervical region: Secondary | ICD-10-CM | POA: Diagnosis not present

## 2023-04-17 DIAGNOSIS — I700 Atherosclerosis of aorta: Secondary | ICD-10-CM | POA: Diagnosis not present

## 2023-04-17 DIAGNOSIS — S3993XA Unspecified injury of pelvis, initial encounter: Secondary | ICD-10-CM | POA: Diagnosis not present

## 2023-04-17 DIAGNOSIS — R21 Rash and other nonspecific skin eruption: Secondary | ICD-10-CM | POA: Diagnosis not present

## 2023-04-17 DIAGNOSIS — S3991XA Unspecified injury of abdomen, initial encounter: Secondary | ICD-10-CM | POA: Diagnosis not present

## 2023-04-17 LAB — COMPREHENSIVE METABOLIC PANEL
ALT: 20 U/L (ref 0–44)
AST: 22 U/L (ref 15–41)
Albumin: 3.9 g/dL (ref 3.5–5.0)
Alkaline Phosphatase: 92 U/L (ref 38–126)
Anion gap: 11 (ref 5–15)
BUN: 20 mg/dL (ref 8–23)
CO2: 27 mmol/L (ref 22–32)
Calcium: 9.5 mg/dL (ref 8.9–10.3)
Chloride: 103 mmol/L (ref 98–111)
Creatinine, Ser: 0.77 mg/dL (ref 0.44–1.00)
GFR, Estimated: >60 mL/min (ref >60)
Glucose, Bld: 207 mg/dL — ABNORMAL HIGH (ref 70–99)
Potassium: 4 mmol/L (ref 3.5–5.1)
Sodium: 141 mmol/L (ref 135–145)
Total Bilirubin: 0.7 mg/dL (ref 0.3–1.2)
Total Protein: 6.7 g/dL (ref 6.5–8.1)

## 2023-04-17 LAB — PROTIME-INR
INR: 1.2 (ref 0.8–1.2)
Prothrombin Time: 15.5 s — ABNORMAL HIGH (ref 11.4–15.2)

## 2023-04-17 LAB — I-STAT CHEM 8, ED
BUN: 24 mg/dL — ABNORMAL HIGH (ref 8–23)
Calcium, Ion: 1.24 mmol/L (ref 1.15–1.40)
Chloride: 104 mmol/L (ref 98–111)
Creatinine, Ser: 0.7 mg/dL (ref 0.44–1.00)
Glucose, Bld: 202 mg/dL — ABNORMAL HIGH (ref 70–99)
HCT: 38 % (ref 36.0–46.0)
Hemoglobin: 12.9 g/dL (ref 12.0–15.0)
Potassium: 4 mmol/L (ref 3.5–5.1)
Sodium: 143 mmol/L (ref 135–145)
TCO2: 28 mmol/L (ref 22–32)

## 2023-04-17 LAB — SAMPLE TO BLOOD BANK

## 2023-04-17 LAB — CBC
HCT: 37.2 % (ref 36.0–46.0)
Hemoglobin: 12.7 g/dL (ref 12.0–15.0)
MCH: 31.4 pg (ref 26.0–34.0)
MCHC: 34.1 g/dL (ref 30.0–36.0)
MCV: 92.1 fL (ref 80.0–100.0)
Platelets: 165 10*3/uL (ref 150–400)
RBC: 4.04 MIL/uL (ref 3.87–5.11)
RDW: 14.4 % (ref 11.5–15.5)
WBC: 10.1 10*3/uL (ref 4.0–10.5)
nRBC: 0 % (ref 0.0–0.2)

## 2023-04-17 LAB — I-STAT CG4 LACTIC ACID, ED: Lactic Acid, Venous: 1.8 mmol/L (ref 0.5–1.9)

## 2023-04-17 LAB — TROPONIN I (HIGH SENSITIVITY)
Troponin I (High Sensitivity): 16 ng/L (ref <18)
Troponin I (High Sensitivity): 18 ng/L — ABNORMAL HIGH (ref <18)

## 2023-04-17 LAB — ETHANOL: Alcohol, Ethyl (B): <10 mg/dL (ref <10)

## 2023-04-17 MED ORDER — TETANUS-DIPHTH-ACELL PERTUSSIS 5-2.5-18.5 LF-MCG/0.5 IM SUSY
0.5000 mL | PREFILLED_SYRINGE | Freq: Once | INTRAMUSCULAR | Status: AC
  Administered 2023-04-17: 0.5 mL via INTRAMUSCULAR
  Filled 2023-04-17: qty 0.5

## 2023-04-17 MED ORDER — CEFAZOLIN SODIUM-DEXTROSE 1-4 GM/50ML-% IV SOLN
1.0000 g | Freq: Once | INTRAVENOUS | Status: AC
  Administered 2023-04-17: 1 g via INTRAVENOUS
  Filled 2023-04-17: qty 50

## 2023-04-17 MED ORDER — LIDOCAINE-EPINEPHRINE-TETRACAINE (LET) TOPICAL GEL
3.0000 mL | Freq: Once | TOPICAL | Status: AC
  Administered 2023-04-17: 3 mL via TOPICAL
  Filled 2023-04-17: qty 3

## 2023-04-17 NOTE — ED Provider Notes (Signed)
[place] EMERGENCY DEPARTMENT AT [facility] Provider Note   CSN: [id number] Arrival date & time: 04/17/23  3244     History  Chief Complaint  Patient presents with   Marletta Lor    Miranda Johnson is a 89 y.o. female.  89 yo female from SNF for witnessed fall on eqliuis. Patient is understands spanish but is nonverbal post strokes. On eliquis. Right supraorbial lac. Bleeding from upper tooth/gums. Abrasion left knee. Shortened right leg.  After son arrived, states patient  had a witnessed fall from her wheel chair today. Fell forward. Concerns for possibly right knee pain after fall. Confirms non-verbal state. Patient also does not communicate well at baseline and doesn't always follow commands.   The history is provided by the patient. No language interpreter was used.  Fall       Home Medications Prior to Admission medications   Medication Sig Start Date End Date Taking? Authorizing Provider  cetirizine (ZYRTEC) 10 MG tablet Take 10 mg by mouth at bedtime. 04/10/23 05/10/23 Yes [provider]  clotrimazole-betamethasone (LOTRISONE) cream Apply 1 Application topically See admin instructions. Apply to the face, neck, and both arms 2 times a day for 14 days 04/10/23 04/24/23 Yes [provider]  ELIQUIS 2.5 MG TABS tablet Take 2.5 mg by mouth 2 (two) times daily. 03/12/22  Yes [provider]  furosemide (LASIX) 20 MG tablet Take 20 mg by mouth daily. 11/19/22  Yes [provider]  guaiFENesin (MUCINEX) 600 MG 12 hr tablet Take 600 mg by mouth every 12 (twelve) hours as needed for cough.   Yes [provider]  lactose free nutrition (BOOST) LIQD Take 237 mLs by mouth 3 (three) times daily between meals.   Yes [provider]  levothyroxine (SYNTHROID) 100 MCG tablet Take 100 mcg by mouth daily before breakfast.   Yes [provider]  metoprolol succinate (TOPROL-XL) 25 MG 24 hr tablet Take 12.5 mg by mouth daily.    Yes [provider]  sertraline (ZOLOFT) 100 MG tablet Take 100 mg by mouth every morning.   Yes [provider]  TYLENOL 500 MG tablet Take 500 mg by mouth every 4 (four) hours as needed (for pain).   Yes [provider]      Allergies    Aspirin    Review of Systems   Review of Systems  Unable to perform ROS: Patient nonverbal    Physical Exam Updated Vital Signs BP (!) 157/66 (BP Location: Left Arm)   Pulse 88   Temp 97.9 F (36.6 C) (Oral)   Resp 16   Ht 5\' 6"  (1.676 m)   Wt 95.6 kg   SpO2 100%   BMI 34.02 kg/m  Physical Exam Vitals and nursing note reviewed.  Constitutional:      General: She is not in acute distress.    Appearance: She is well-developed.  HENT:     Head: Laceration present.      Mouth/Throat:   Eyes:     General: Lids are normal.     Extraocular Movements: Extraocular movements intact.     Conjunctiva/sclera: Conjunctivae normal.     Pupils: Pupils are equal, round, and reactive to light.     Comments: Unable to participate in visual field or gross vision exam.  Cardiovascular:     Rate and Rhythm: Normal rate and regular rhythm.     Heart sounds: No murmur heard. Pulmonary:     Effort: Pulmonary effort is normal.  No respiratory distress.     Breath sounds: Normal breath sounds.  Abdominal:     Palpations: Abdomen is soft.     Tenderness: There is no abdominal tenderness.  Musculoskeletal:        General: No swelling.     Right shoulder: Normal.     Left shoulder: Normal.     Right upper arm: Normal.     Left upper arm: Normal.     Right elbow: Normal.     Left elbow: Normal.     Right forearm: Normal.     Left forearm: Normal.     Right wrist: Normal.     Left wrist: Normal.     Cervical back: Neck supple. No pain with movement.     Thoracic back: No bony tenderness.     Lumbar back: No bony tenderness.     Right hip: Normal.     Left hip: Normal.     Right upper leg: Normal.     Left upper leg:  Normal.     Right knee: Normal.     Left knee: Normal.     Right lower leg: Normal.     Left lower leg: Normal.     Right ankle: Normal.     Left ankle: Normal.     Comments: Right leg shorter than left. May be positional. No hip pain on palpation.  Skin:    General: Skin is warm and dry.     Capillary Refill: Capillary refill takes less than 2 seconds.  Neurological:     Mental Status: She is alert.     GCS: GCS eye subscore is 4. GCS verbal subscore is 1. GCS motor subscore is 4.     ED Results / Procedures / Treatments   Labs (all labs ordered are listed, but only abnormal results are displayed) Labs Reviewed  COMPREHENSIVE METABOLIC PANEL - Abnormal; Notable for the following components:      Result Value   Glucose, Bld 207 (*)    All other components within normal limits  PROTIME-INR - Abnormal; Notable for the following components:   Prothrombin Time 15.5 (*)    All other components within normal limits  I-STAT CHEM 8, ED - Abnormal; Notable for the following components:   BUN 24 (*)    Glucose, Bld 202 (*)    All other components within normal limits  TROPONIN I (HIGH SENSITIVITY) - Abnormal; Notable for the following components:   Troponin I (High Sensitivity) 18 (*)    All other components within normal limits  CBC  ETHANOL  URINALYSIS, ROUTINE W REFLEX MICROSCOPIC  I-STAT CG4 LACTIC ACID, ED  SAMPLE TO BLOOD BANK  TROPONIN I (HIGH SENSITIVITY)    EKG EKG Interpretation Date/Time:  Thursday April 17 2023 19:11:41 EDT Ventricular Rate:  92 PR Interval:    QRS Duration:  79 QT Interval:  361 QTC Calculation: 447 R Axis:   -35  Text Interpretation: duplicate, discard Confirmed by Dione Booze ([id number]) on 04/18/2023 12:46:02 AM  Radiology CT CERVICAL SPINE WO CONTRAST  Result Date: 04/17/2023 CLINICAL DATA:  Neck trauma (Age >= 65y).  Fall. EXAM: CT CERVICAL SPINE WITHOUT CONTRAST TECHNIQUE: Multidetector CT imaging of the cervical spine was performed  without intravenous contrast. Multiplanar CT image reconstructions were also generated. RADIATION DOSE REDUCTION: This exam was performed according to the departmental dose-optimization program which includes automated exposure control, adjustment of the mA and/or kV according to patient size and/or use of iterative reconstruction technique.  COMPARISON:  None Available. FINDINGS: Alignment: Image quality degraded by motion.  No subluxation. Skull base and vertebrae: No acute fracture. No primary bone lesion or focal pathologic process. Soft tissues and spinal canal: No prevertebral fluid or swelling. No visible canal hematoma. Disc levels: Advanced diffuse degenerative facet disease bilaterally. Moderate degenerative disc disease most pronounced in the lower cervical spine. Multilevel bilateral neural foraminal narrowing. Upper chest: Biapical scarring. Other: None IMPRESSION: No acute bony abnormality. Electronically Signed   By: Charlett Nose M.D.   On: 04/17/2023 20:33   CT MAXILLOFACIAL WO CONTRAST  Result Date: 04/17/2023 CLINICAL DATA:  Facial trauma, blunt.  Fall. EXAM: CT MAXILLOFACIAL WITHOUT CONTRAST TECHNIQUE: Multidetector CT imaging of the maxillofacial structures was performed. Multiplanar CT image reconstructions were also generated. RADIATION DOSE REDUCTION: This exam was performed according to the departmental dose-optimization program which includes automated exposure control, adjustment of the mA and/or kV according to patient size and/or use of iterative reconstruction technique. COMPARISON:  None Available. FINDINGS: Osseous: No fracture or mandibular dislocation. No destructive process. Orbits: Negative. No traumatic or inflammatory finding. Sinuses: Clear Soft tissues: Negative Limited intracranial: See head CT report IMPRESSION: No facial or orbital fracture. Electronically Signed   By: Charlett Nose M.D.   On: 04/17/2023 20:31   CT CHEST ABDOMEN PELVIS WO CONTRAST  Result Date:  04/17/2023 CLINICAL DATA:  Polytrauma, blunt, fall EXAM: CT CHEST, ABDOMEN AND PELVIS WITHOUT CONTRAST TECHNIQUE: Multidetector CT imaging of the chest, abdomen and pelvis was performed following the standard protocol without IV contrast. RADIATION DOSE REDUCTION: This exam was performed according to the departmental dose-optimization program which includes automated exposure control, adjustment of the mA and/or kV according to patient size and/or use of iterative reconstruction technique. COMPARISON:  None Available. FINDINGS: CT CHEST FINDINGS Cardiovascular: Cardiomegaly. Scattered coronary artery calcifications. Diffuse aortic calcifications. No aneurysm. Mediastinum/Nodes: No mediastinal, hilar, or axillary adenopathy. Trachea and esophagus are unremarkable. Thyroid unremarkable. Lungs/Pleura: Biapical scarring. No confluent airspace opacities, effusions or pneumothorax. Musculoskeletal: Chest wall soft tissues are unremarkable. No acute bony abnormality. CT ABDOMEN PELVIS FINDINGS Hepatobiliary: No focal hepatic abnormality. Gallbladder unremarkable. Pancreas: Atrophic.  No focal abnormality or ductal dilatation. Spleen: No focal abnormality.  Normal size. Adrenals/Urinary Tract: No adrenal abnormality. No focal renal abnormality. No stones or hydronephrosis. Urinary bladder is unremarkable. Stomach/Bowel: Stomach, large and small bowel grossly unremarkable. Vascular/Lymphatic: Diffuse aortic atherosclerosis. No evidence of aneurysm or adenopathy. Reproductive: Uterus and adnexa unremarkable.  No mass. Other: No free fluid or free air. Musculoskeletal: No acute bony abnormality. Advanced degenerative facet disease in the lower lumbar spine with 7 mm of anterolisthesis of L5 on S1 with advanced degenerative disc disease. IMPRESSION: No acute findings or evidence of significant traumatic injury in the chest, abdomen or pelvis. Cardiomegaly. Aortic atherosclerosis. Electronically Signed   By: Charlett Nose M.D.    On: 04/17/2023 20:27   CT HEAD WO CONTRAST  Result Date: 04/17/2023 CLINICAL DATA:  Head trauma, moderate-severe EXAM: CT HEAD WITHOUT CONTRAST TECHNIQUE: Contiguous axial images were obtained from the base of the skull through the vertex without intravenous contrast. RADIATION DOSE REDUCTION: This exam was performed according to the departmental dose-optimization program which includes automated exposure control, adjustment of the mA and/or kV according to patient size and/or use of iterative reconstruction technique. COMPARISON:  01/25/2021 FINDINGS: Brain: Large old left MCA infarct with encephalomalacia involving much of the left cerebral hemisphere, stable. Old right posterior parietal infarct, stable. No acute intracranial abnormality. Specifically, no hemorrhage, hydrocephalus, mass lesion,  acute infarction, or significant intracranial injury. Vascular: No hyperdense vessel or unexpected calcification. Skull: No acute calvarial abnormality. Sinuses/Orbits: No acute findings Other: None IMPRESSION: Old large left MCA infarct and right posterior parietal infarct with encephalomalacia. No acute intracranial abnormality. Electronically Signed   By: Charlett Nose M.D.   On: 04/17/2023 20:23   DG Pelvis Portable  Result Date: 04/17/2023 CLINICAL DATA:  Trauma, fall EXAM: PORTABLE PELVIS 1-2 VIEWS COMPARISON:  None Available. FINDINGS: The bones are osteopenic. There is no evidence of pelvic fracture or diastasis. No pelvic bone lesions are seen. IMPRESSION: Osteopenia. No evidence of pelvic fracture or diastasis. Electronically Signed   By: Darliss Cheney M.D.   On: 04/17/2023 19:49   DG Chest Port 1 View  Result Date: 04/17/2023 CLINICAL DATA:  Trauma EXAM: PORTABLE CHEST 1 VIEW COMPARISON:  Chest x-ray 01/17/2021 FINDINGS: Heart is enlarged. The lungs are clear. There is no pleural effusion or pneumothorax. No acute fractures are seen. IMPRESSION: Cardiomegaly. No acute pulmonary process.  Electronically Signed   By: Darliss Cheney M.D.   On: 04/17/2023 19:49    Procedures .Critical Care  Performed by: Franne Forts, DO Authorized by: Franne Forts, DO   Critical care provider statement:    Critical care time (minutes):  30   Critical care was necessary to treat or prevent imminent or life-threatening deterioration of the following conditions: fall on blood thinner.   Critical care was time spent personally by me on the following activities:  Development of treatment plan with patient or surrogate, discussions with consultants, evaluation of patient's response to treatment, examination of patient, ordering and review of laboratory studies, ordering and review of radiographic studies, ordering and performing treatments and interventions, pulse oximetry, re-evaluation of patient's condition and review of old charts .Marland KitchenLaceration Repair  Date/Time: 04/18/2023 1:47 AM  Performed by: Franne Forts, DO Authorized by: Franne Forts, DO   Consent:    Consent obtained:  Emergent situation   Consent given by:  Patient   Risks, benefits, and alternatives were discussed: yes     Risks discussed:  Infection and pain   Alternatives discussed:  No treatment Universal protocol:    Immediately prior to procedure, a time out was called: yes     Patient identity confirmed:  Verbally with patient, arm band and provided demographic data Laceration details:    Location:  Scalp   Scalp location:  Frontal   Length (cm):  2 Treatment:    Irrigation solution:  Sterile saline   Irrigation method:  Syringe   Visualized foreign bodies/material removed: no   Skin repair:    Repair method:  Sutures   Suture size:  6-0   Suture technique:  Running   Number of sutures:  1 Approximation:    Approximation:  Close Repair type:    Repair type:  Simple Post-procedure details:    Procedure completion:  Tolerated well, no immediate complications     Medications Ordered in ED Medications   Tdap (BOOSTRIX) injection 0.5 mL (0.5 mLs Intramuscular Given 04/17/23 1952)  ceFAZolin (ANCEF) IVPB 1 g/50 mL premix (0 g Intravenous Stopped 04/17/23 2022)  lidocaine-EPINEPHrine-tetracaine (LET) topical gel (3 mLs Topical Given 04/17/23 2212)    ED Course/ Medical Decision Making/ A&P                                 Medical Decision Making Amount and/or Complexity of Data  Reviewed Labs: ordered. Radiology: ordered.  Risk Prescription drug management.  1:46 AM 89 yo female from SNF for witnessed fall and head lac. Patient is alert, nonverbal, does not follow commands. This appears to be patients baseline as per son at bedside. CT head and neck demonstrates no acute process. 2 cm lac to right supraorbital region. Irrigated and closed with running suture. Tdap given. Ancef given for concerns for facial fracture on initial evaluation. CT face demonstrates no fractures. Unable to get good physical exam due to nonverbal state and inabilty to follow commands. CTA ches/abd, and pelvis demonstrates no acute process.     Patient safe for dc home at this time and recommendations for suture removal in 7-10 days.   Patient in no distress and overall condition improved here in the ED. Detailed discussions were had with the patient regarding current findings, and need for close f/u with PCP or on call doctor. The patient has been instructed to return immediately if the symptoms worsen in any way for re-evaluation. Patient verbalized understanding and is in agreement with current care plan. All questions answered prior to discharge.         Final Clinical Impression(s) / ED Diagnoses Final diagnoses:  Fall, initial encounter  Injury of head, initial encounter  Traumatic head injury with multiple lacerations, initial encounter  Dislocation of tooth, initial encounter    Rx / DC Orders ED Discharge Orders     None         Franne Forts, DO 04/18/23 0147

## 2023-04-17 NOTE — ED Notes (Signed)
Trauma Response Nurse Documentation   Miranda Johnson is a 89 y.o. female arriving to [facility] ED via EMS  On Eliquis (apixaban) daily. Trauma was activated as a Level 2 by ED charge RN based on the following trauma criteria Elderly patients > 65 with head trauma on anti-coagulation (excluding ASA).  Patient cleared for CT by Dr. Wallace Cullens EDP. Pt transported to CT with trauma response nurse present to monitor. RN remained with the patient throughout their absence from the department for clinical observation.   GCS 10 (Which is patient's baseline per EMS).  History   Past Medical History:  Diagnosis Date   Atrial fibrillation (HCC)    Hyperlipidemia    Hypertension    Shortness of breath    Stroke (HCC)      No past surgical history on file.     Initial Focused Assessment (If applicable, or please see trauma documentation): Right forehead lac, right upper dental bleeding (7&8), nose abrasions, left knee abrasions, right leg shortening, left head hematoma  Airway patent, unobstructed, BS clear Bleeding to head lac controlled, BP stable GCS 10 (E4V1M5) PERRLA 2  CT's Completed:   CT Head, CT Maxillofacial, CT C-Spine, CT Chest w/ contrast, and CT abdomen/pelvis w/ contrast   Interventions:  IV start and trauma start Portable chest and pelvis XRAY CT head, maxface, C-spine, chest/abd/pelvis TDAP ANCEF  Plan for disposition:  Pending workup   Consults completed:  None at the time of this note  Event Summary: Presents via EMS for unwitnessed fall from SNF. Staff found her down and picked her up to chair prior to EMS arrival. C-collar in place. Pt nonverbal, doesn't follow commands at baseline. Hx stroke, eliquis. Spanish understanding per EMS. Laceration to right brow, bleeding to right upper teeth 7&8, nose abrasions, left knee abrasion, left posterior head hematoma, right leg shortening. Scans unremarkable. Anticipate discharge back to facility.   MTP Summary (If  applicable): NA  Bedside handoff with ED RN Miranda Johnson.    Miranda Johnson  Trauma Response RN  Please call TRN at [phone] for further assistance.

## 2023-04-17 NOTE — ED Triage Notes (Signed)
Level 2 fall on thinners. Unwitnessed fall in dining room. Pt fell from wheelchair to ground. Spanish speaking. Nonverbal due to previous stroke. At baseline mentation per staff. Unknown LOC.   EMS VS 148/86 HR 75 RR 20 98% on RA

## 2023-04-17 NOTE — Discharge Instructions (Signed)
Follow-up with the primary care physician in 7 to 10 days for suture removal.

## 2023-04-18 DIAGNOSIS — Z743 Need for continuous supervision: Secondary | ICD-10-CM | POA: Diagnosis not present

## 2023-04-18 DIAGNOSIS — S0990XA Unspecified injury of head, initial encounter: Secondary | ICD-10-CM | POA: Diagnosis not present

## 2023-04-18 DIAGNOSIS — W19XXXA Unspecified fall, initial encounter: Secondary | ICD-10-CM | POA: Diagnosis not present

## 2023-04-18 DIAGNOSIS — R531 Weakness: Secondary | ICD-10-CM | POA: Diagnosis not present

## 2023-04-21 NOTE — Plan of Care (Signed)
[facility] Tonsillectomy/Adenoidectomy, Postoperative PEDS care plan entered in error.

## 2023-06-01 DEATH — deceased

## 2023-07-28 IMAGING — CT CT HEAD W/O CM
4 series · 16 of 47 positions shown, 18 images · non-contrast
Comparison: CT head 03/16/2007

CLINICAL DATA: Fall.  Injury

EXAM:
CT HEAD WITHOUT CONTRAST
CT CERVICAL SPINE WITHOUT CONTRAST
TECHNIQUE: Multidetector CT imaging of the head and cervical spine was
performed following the standard protocol without intravenous
contrast. Multiplanar CT image reconstructions of the cervical spine
were also generated.

[Series 3: head without · axial · non-contrast · 0.46mm/px · z∈[-152,-37]mm · 7 of 31 slices shown, 9 images]
[im 4/31  brain]
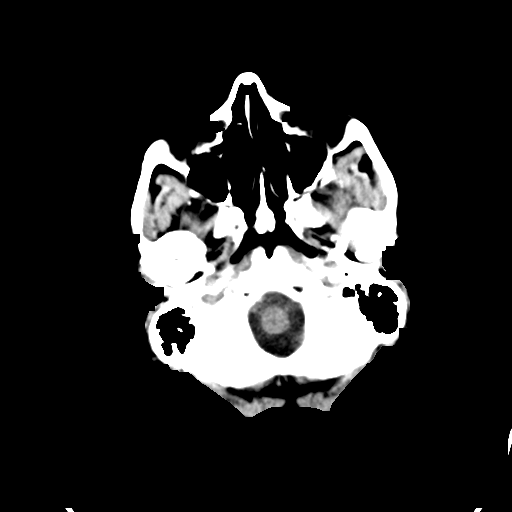
[im 4/31  bone]
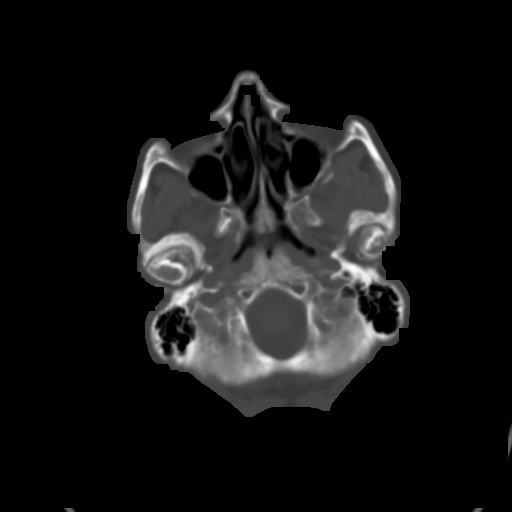
[im 8/31  brain]
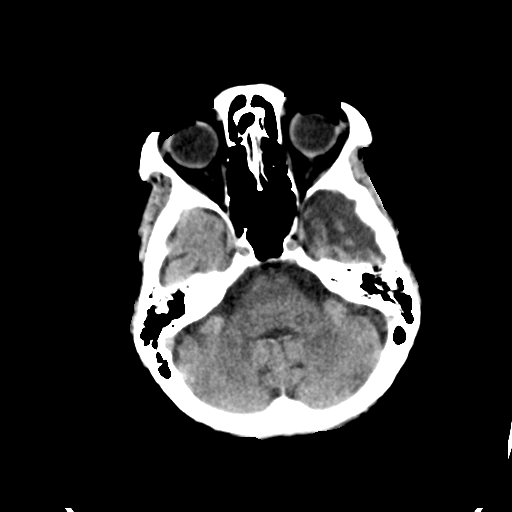
[im 12/31  brain]
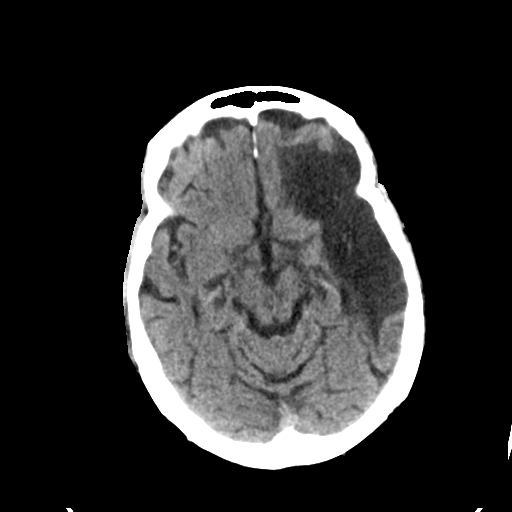
[im 16/31  brain]
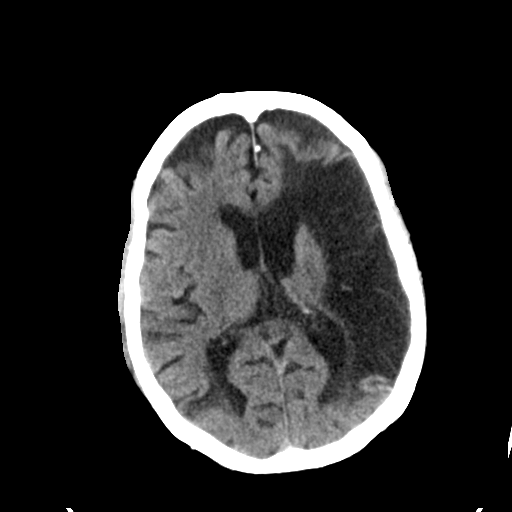
[im 19/31  brain]
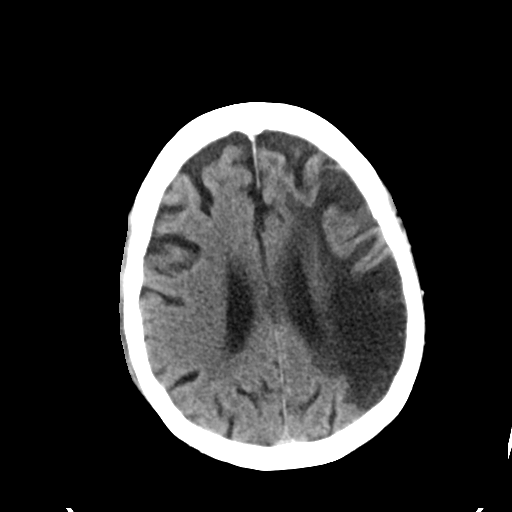
[im 19/31  bone]
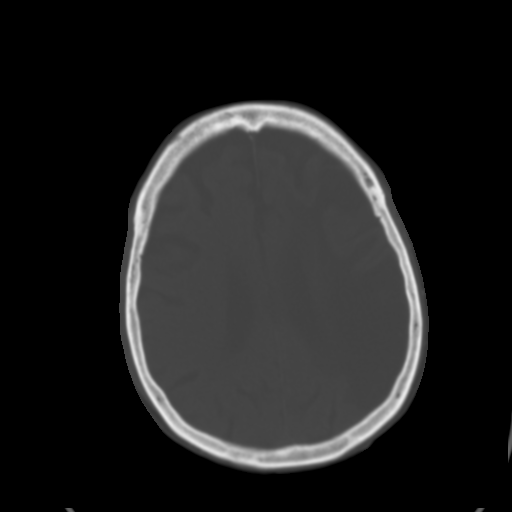
[im 23/31  brain]
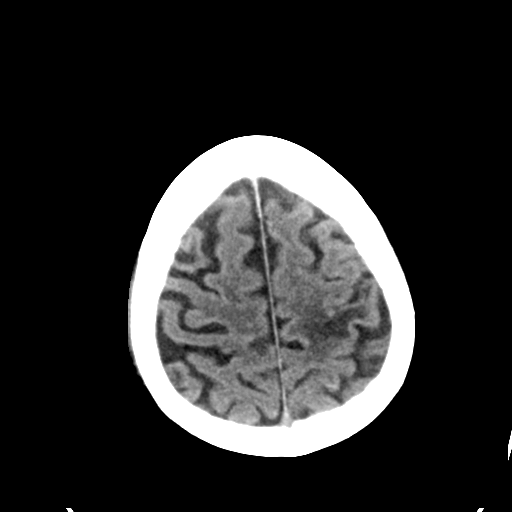
[im 27/31  brain]
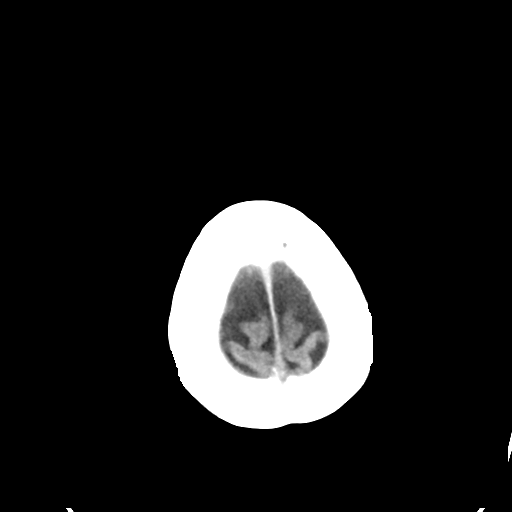

[Series 4: head bone · axial · 0.46mm/px · z∈[-153,-123]mm · 3 of 76 slices shown]
[im 8/76  bone]
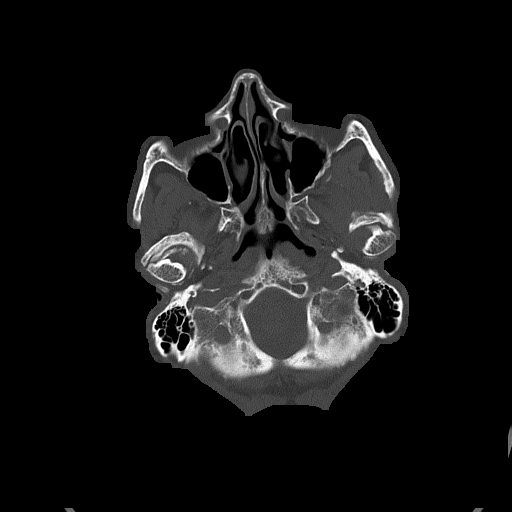
[im 16/76  bone]
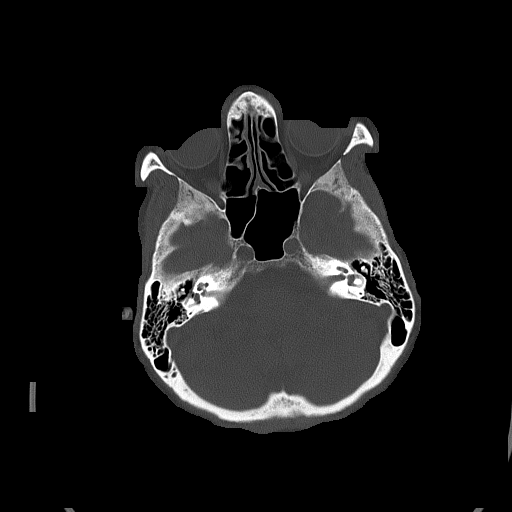
[im 23/76  bone]
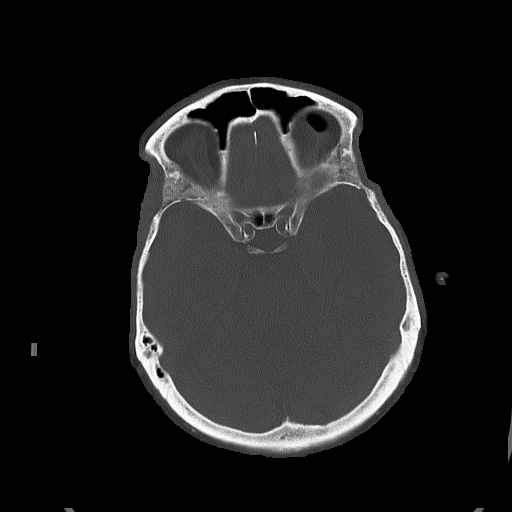

[Series 5: head without cor · coronal · non-contrast · 0.32mm/px · 3 of 67 slices shown]
[im 23/67  brain]
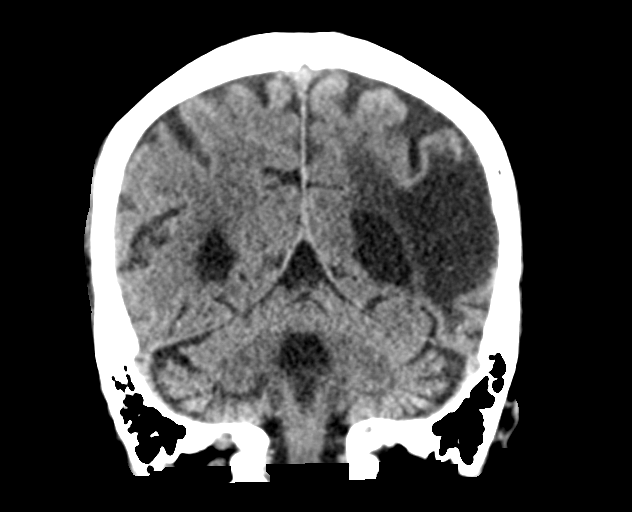
[im 30/67  brain]
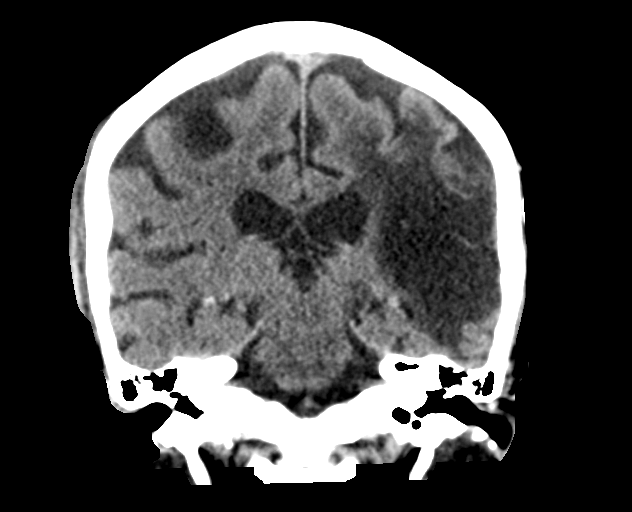
[im 37/67  brain]
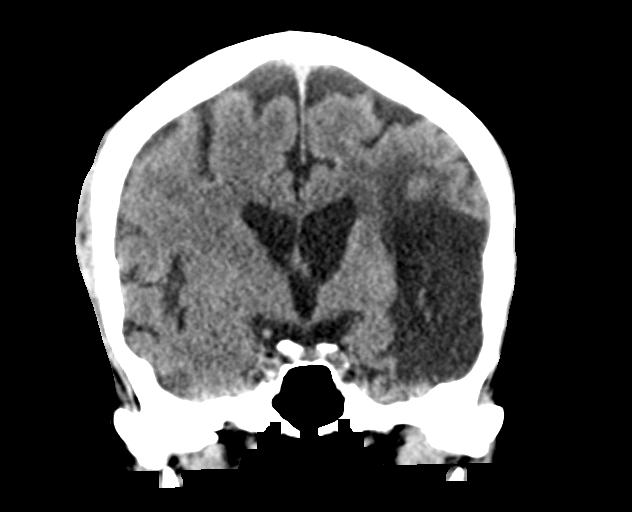

[Series 6: head without sag · sagittal · non-contrast · 0.32mm/px · 3 of 64 slices shown]
[im 22/64  brain]
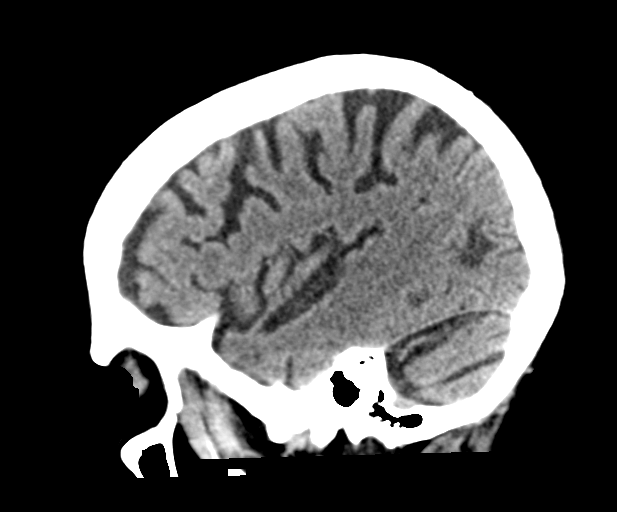
[im 32/64  brain]
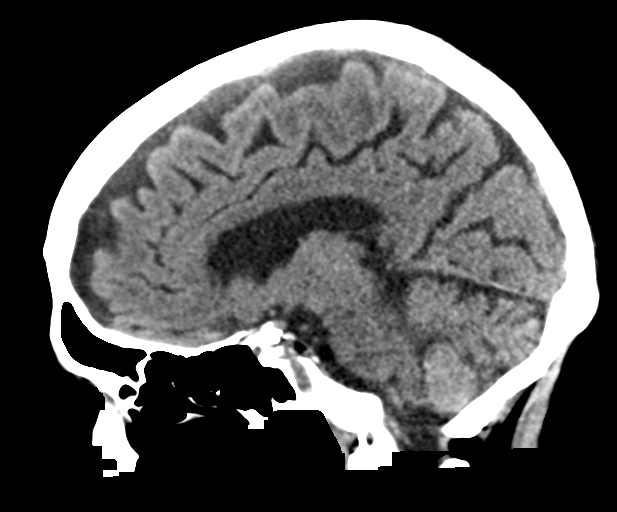
[im 43/64  brain]
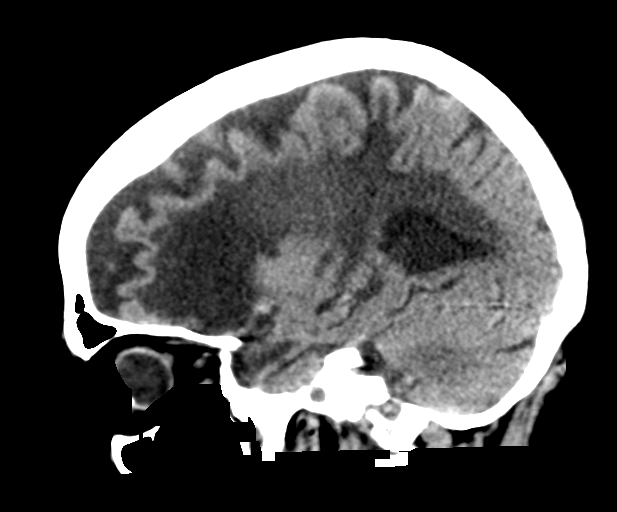

[16 of 47 positions shown; findings below may reference images not displayed]

FINDINGS: CT HEAD FINDINGS

Brain: Large territory chronic infarct left MCA territory unchanged
from the prior study. Smaller chronic infarct in the right occipital
parietal lobe also unchanged. Progressive atrophy. No hydrocephalus

Negative for acute infarct, hemorrhage, mass

Vascular: Calcification left MCA likely atherosclerotic. Negative
for hyperdense MCA.

Skull: Negative

Sinuses/Orbits: Negative

Other: None

CT CERVICAL SPINE FINDINGS

Alignment: Slight anterolisthesis C3-4.  Mild anterolisthesis C7-T1

Skull base and vertebrae: Negative for fracture

Soft tissues and spinal canal: Cystic nodule left thyroid measuring
18 mm in diameter. Small amount of thyroid tissue on the right.
Atherosclerotic calcification carotid bifurcation bilaterally

Disc levels: Multilevel disc and facet degeneration throughout the
cervical spine with spurring. Bilateral foraminal encroachment due
to spurring.

Upper chest: Pleuroparenchymal scarring in the apices bilaterally.

Other: None
IMPRESSION: No acute intracranial abnormality

Negative for cervical fracture.

Cystic nodule left thyroid. Thyroid ultrasound recommended. (Ref: [HOSPITAL]. [DATE]): 143-50).

## 2023-07-28 IMAGING — CT CT CERVICAL SPINE W/O CM
3 of 4 series · 10 of 33 positions shown, 12 images · non-contrast
Comparison: CT head 03/16/2007

CLINICAL DATA: Fall.  Injury

EXAM:
CT HEAD WITHOUT CONTRAST
CT CERVICAL SPINE WITHOUT CONTRAST
TECHNIQUE: Multidetector CT imaging of the head and cervical spine was
performed following the standard protocol without intravenous
contrast. Multiplanar CT image reconstructions of the cervical spine
were also generated.

[Series 4: c_spine 2.0 st · axial · 0.35mm/px · z∈[-266,-202]mm · 2 of 96 slices shown, 3 images]
[im 32/96  soft-tissue]
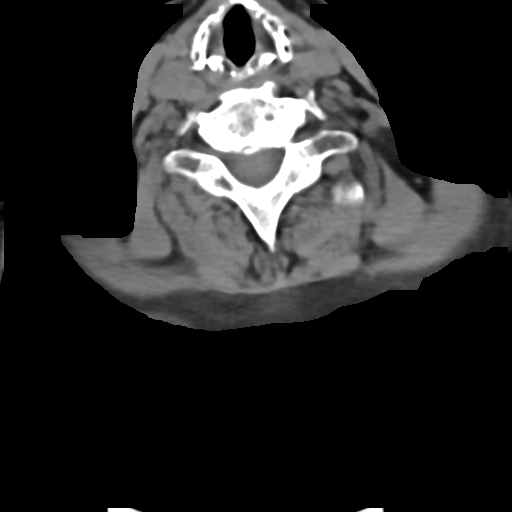
[im 32/96  bone]
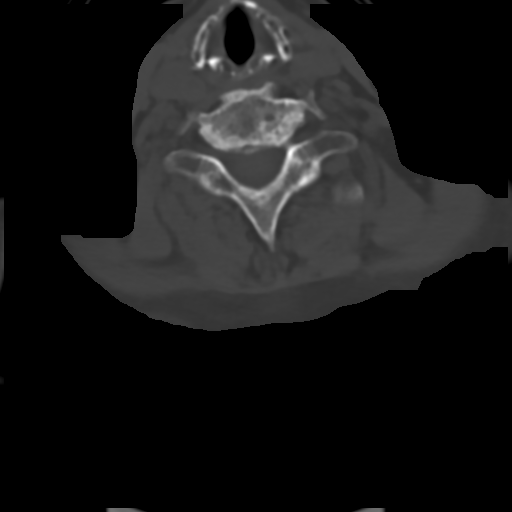
[im 64/96  bone]
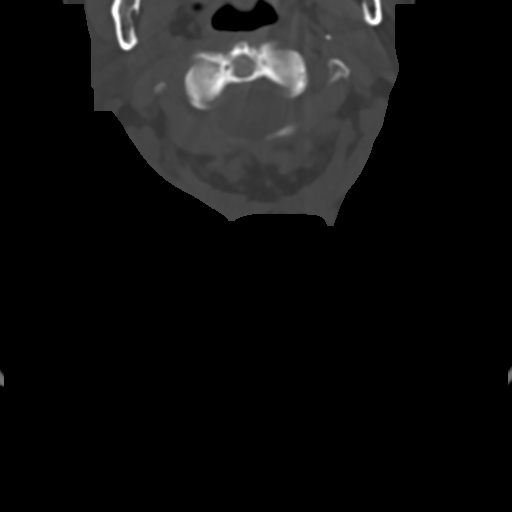

[Series 8: c_spine 2.0 sag bone · sagittal · 0.28mm/px · 5 of 61 slices shown, 6 images]
[im 21/61  bone]
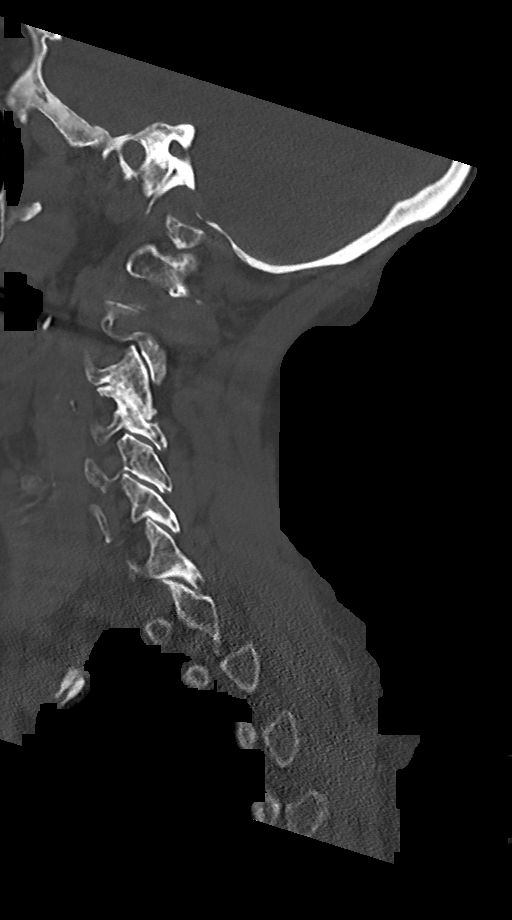
[im 26/61  bone]
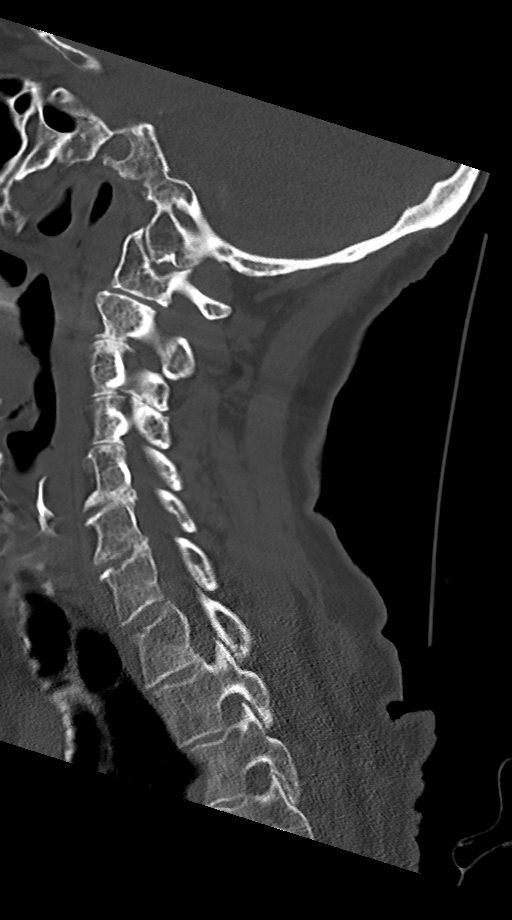
[im 31/61  soft-tissue]
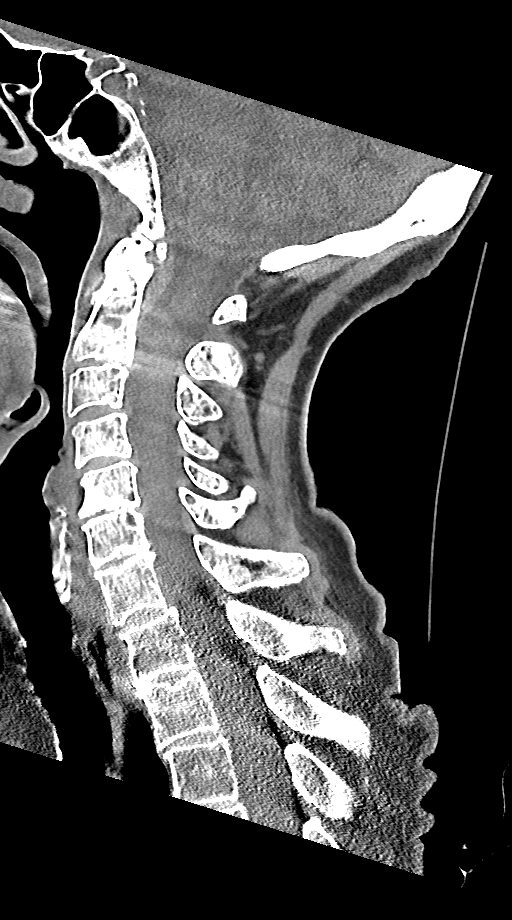
[im 31/61  bone]
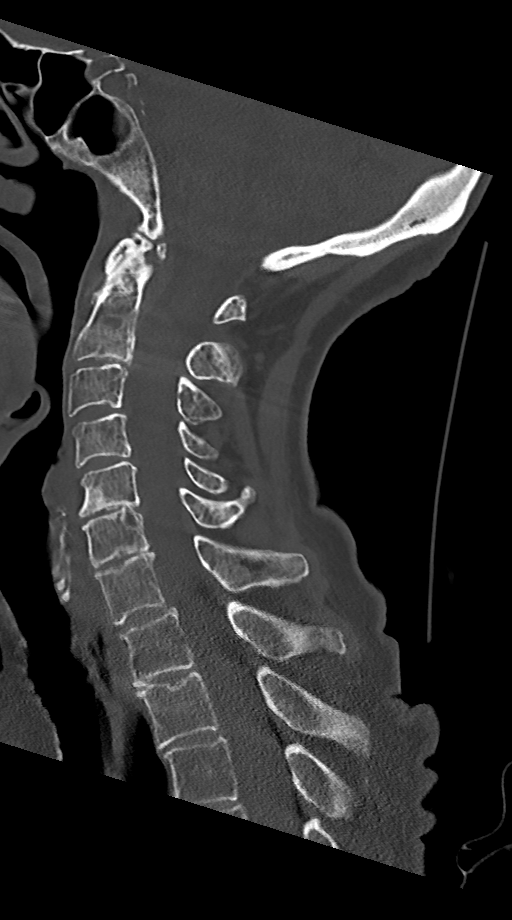
[im 36/61  bone]
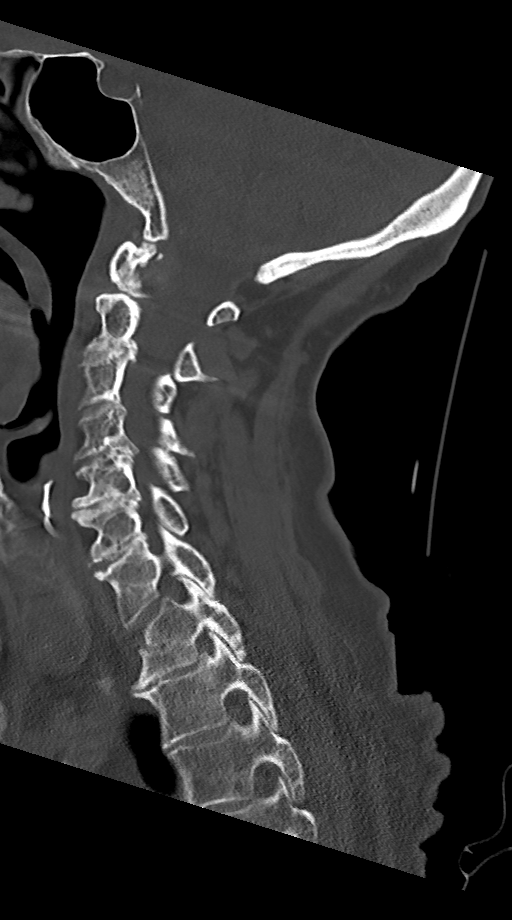
[im 41/61  bone]
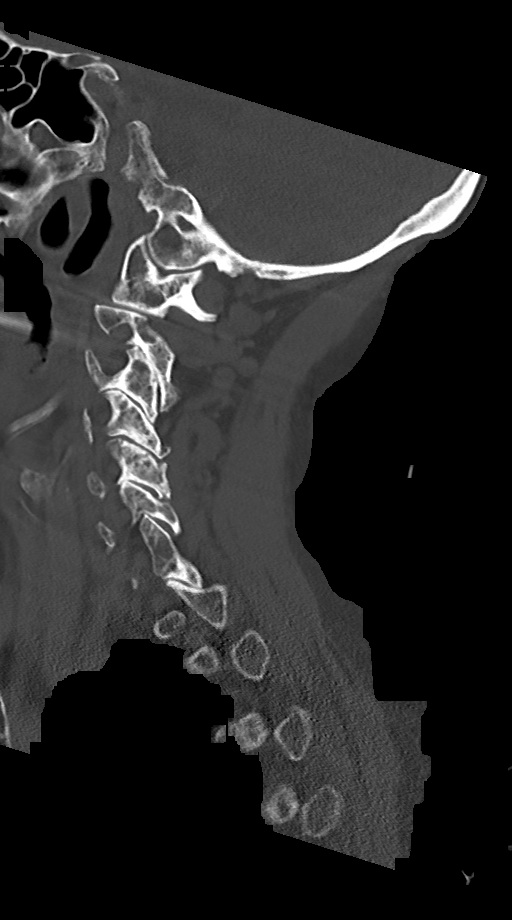

[Series 9: c_spine 2.0 cor bone · coronal · 0.28mm/px · 3 of 60 slices shown]
[im 12/60  bone]
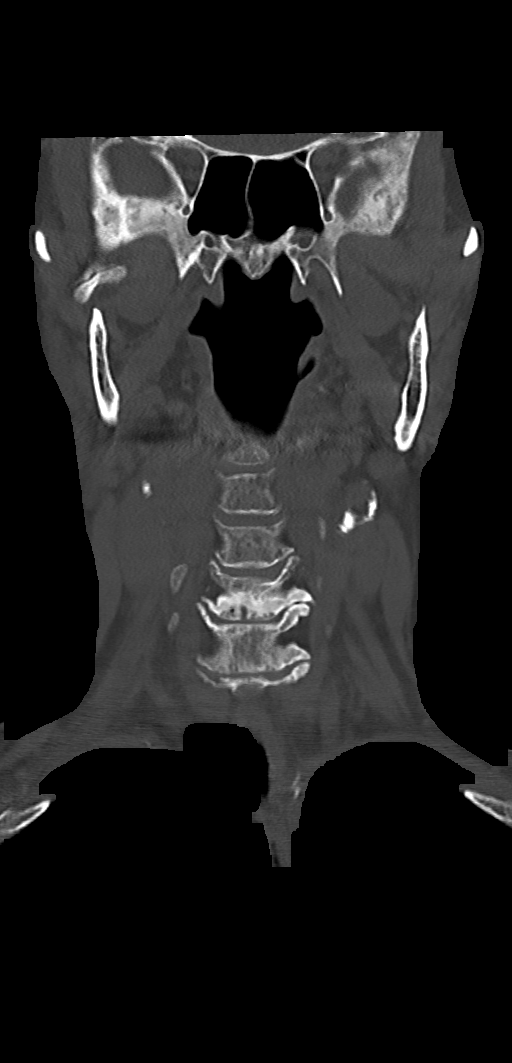
[im 24/60  bone]
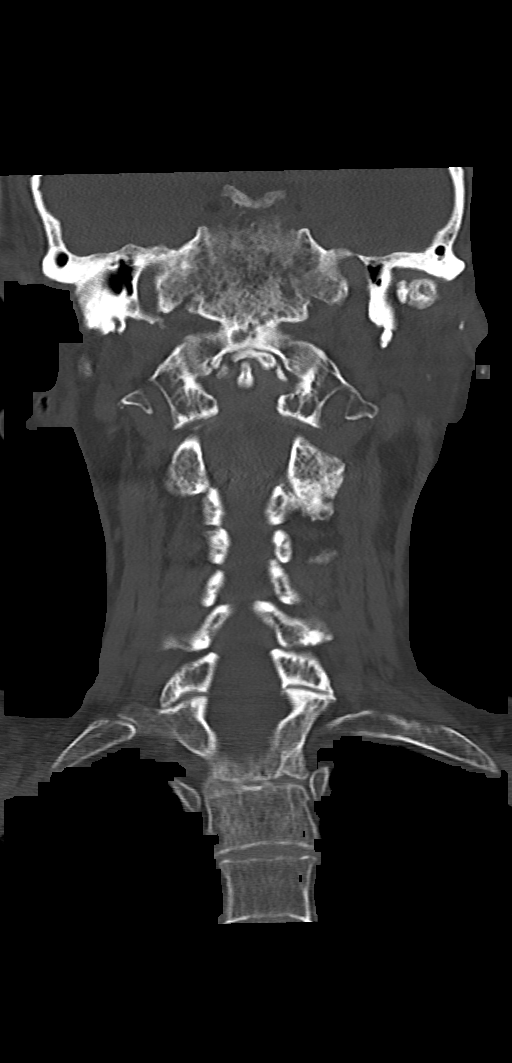
[im 36/60  bone]
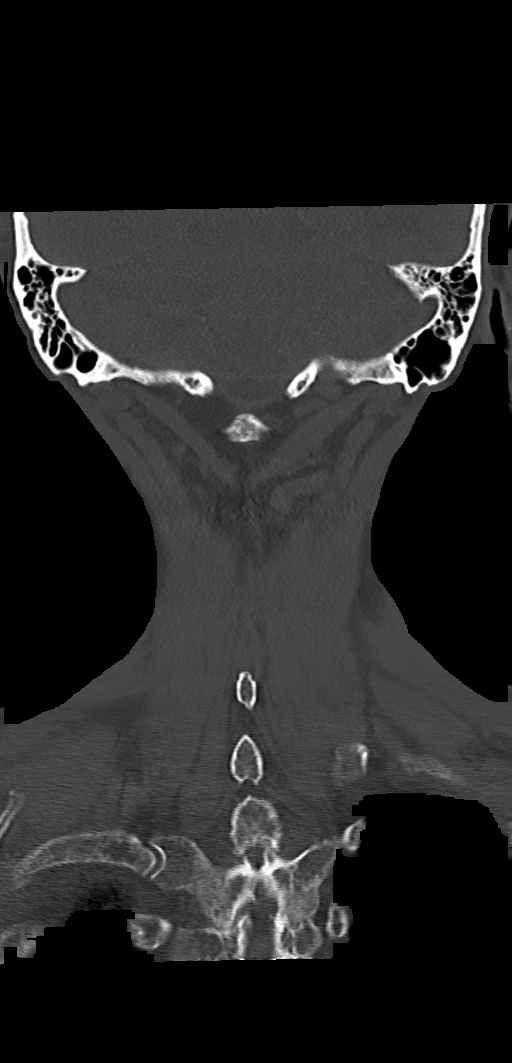

[10 of 33 positions shown; findings below may reference images not displayed]

FINDINGS: CT HEAD FINDINGS

Brain: Large territory chronic infarct left MCA territory unchanged
from the prior study. Smaller chronic infarct in the right occipital
parietal lobe also unchanged. Progressive atrophy. No hydrocephalus

Negative for acute infarct, hemorrhage, mass

Vascular: Calcification left MCA likely atherosclerotic. Negative
for hyperdense MCA.

Skull: Negative

Sinuses/Orbits: Negative

Other: None

CT CERVICAL SPINE FINDINGS

Alignment: Slight anterolisthesis C3-4.  Mild anterolisthesis C7-T1

Skull base and vertebrae: Negative for fracture

Soft tissues and spinal canal: Cystic nodule left thyroid measuring
18 mm in diameter. Small amount of thyroid tissue on the right.
Atherosclerotic calcification carotid bifurcation bilaterally

Disc levels: Multilevel disc and facet degeneration throughout the
cervical spine with spurring. Bilateral foraminal encroachment due
to spurring.

Upper chest: Pleuroparenchymal scarring in the apices bilaterally.

Other: None
IMPRESSION: No acute intracranial abnormality

Negative for cervical fracture.

Cystic nodule left thyroid. Thyroid ultrasound recommended. (Ref: [HOSPITAL]. [DATE]): 143-50).

## 2023-07-28 IMAGING — DX DG PELVIS 1-2V
1 series · 1 of 1 positions shown · non-contrast
Comparison: None.

CLINICAL DATA: Unwitnessed fall.

EXAM:
PELVIS - 1-2 VIEW

[pelvis ap]
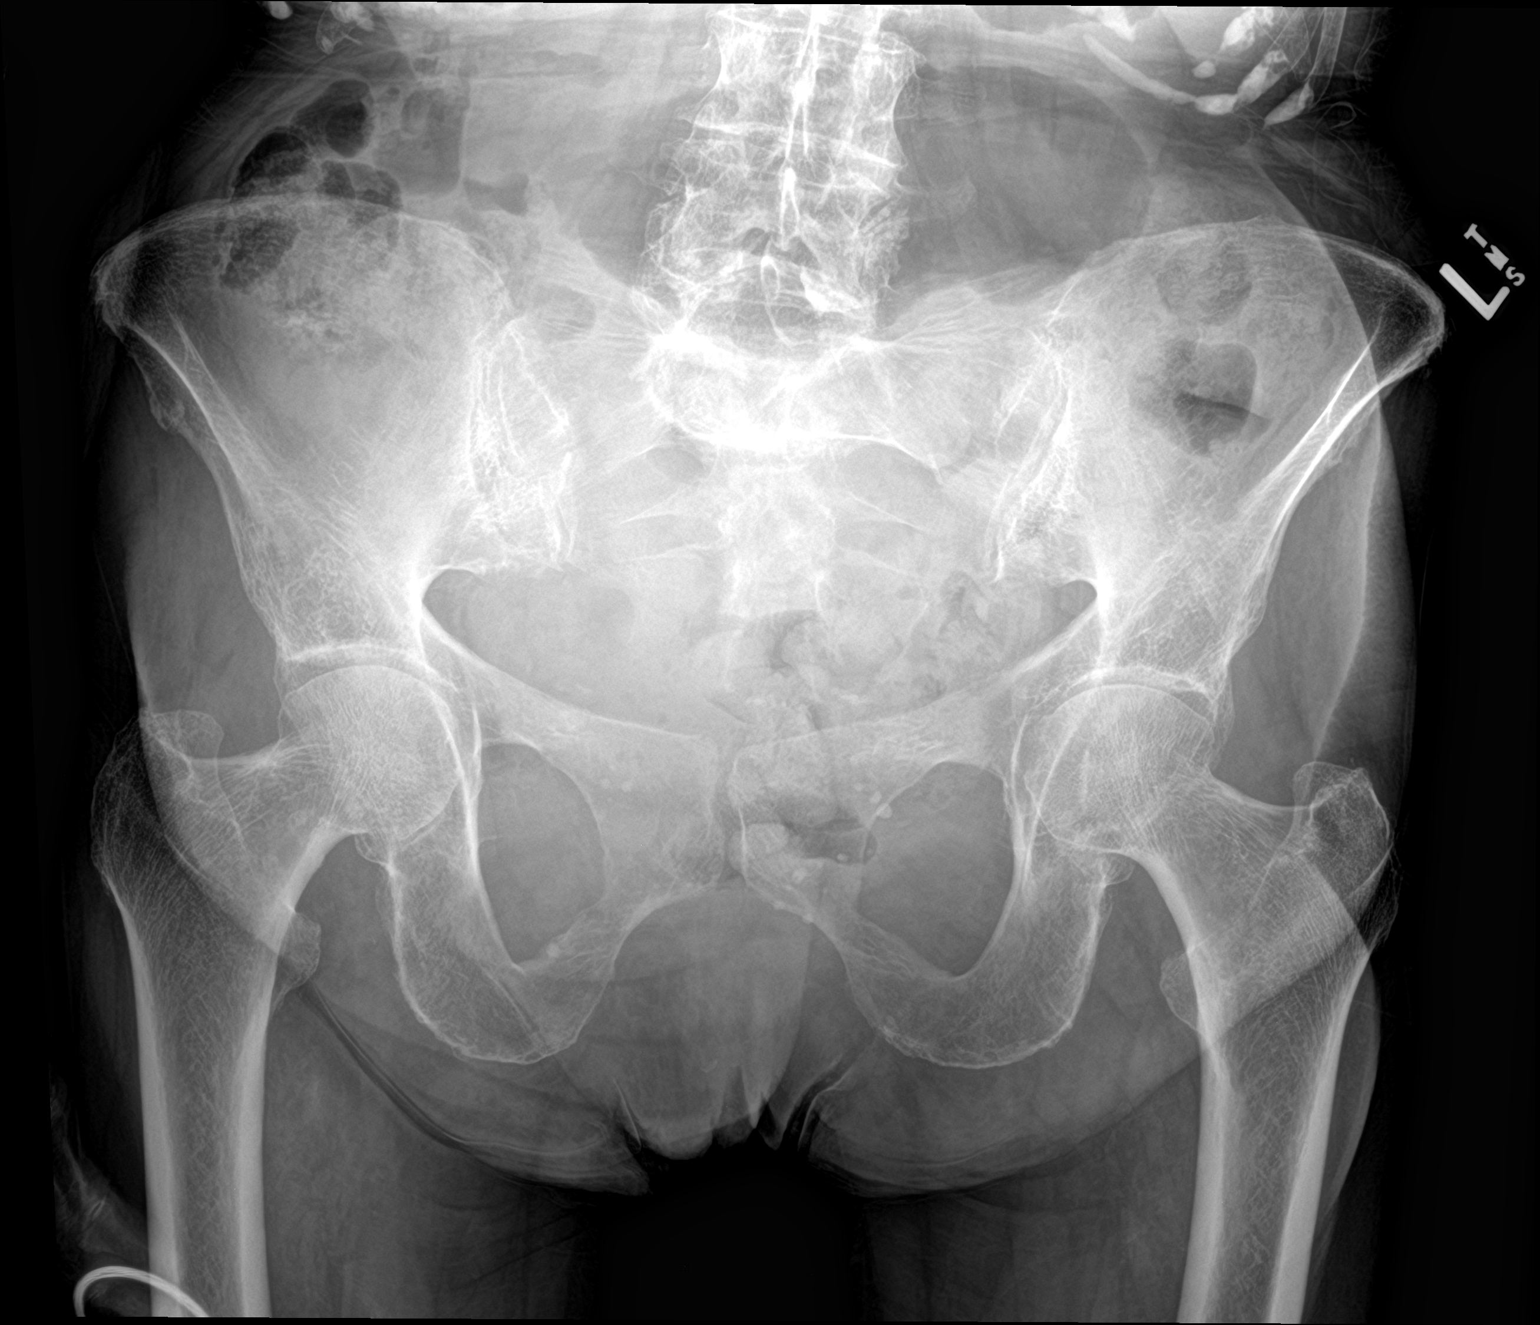

[1 of 1 positions shown; findings below may reference images not displayed]

FINDINGS: There is no evidence of pelvic fracture or diastasis. No pelvic bone
lesions are seen.
IMPRESSION: Negative.
